# Patient Record
Sex: Female | Born: 1945 | Race: White | Hispanic: No | Marital: Married | Smoking: Never smoker
Health system: Southern US, Community
[De-identification: ages and names within clinical notes are randomized; demographics above are authoritative.]

## PROBLEM LIST (undated history)

## (undated) DIAGNOSIS — K219 Gastro-esophageal reflux disease without esophagitis: Secondary | ICD-10-CM

## (undated) DIAGNOSIS — T4145XA Adverse effect of unspecified anesthetic, initial encounter: Secondary | ICD-10-CM

## (undated) DIAGNOSIS — T8859XA Other complications of anesthesia, initial encounter: Secondary | ICD-10-CM

## (undated) DIAGNOSIS — M1990 Unspecified osteoarthritis, unspecified site: Secondary | ICD-10-CM

## (undated) HISTORY — PX: JOINT REPLACEMENT: SHX530

## (undated) HISTORY — PX: TUBAL LIGATION: SHX77

## (undated) HISTORY — PX: DILATION AND CURETTAGE OF UTERUS: SHX78

---
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
## 1965-06-02 HISTORY — PX: CHOLECYSTECTOMY: SHX55

## 2004-11-20 ENCOUNTER — Ambulatory Visit: Payer: Self-pay | Admitting: Family Medicine

## 2005-01-10 ENCOUNTER — Ambulatory Visit: Payer: Self-pay | Admitting: Gastroenterology

## 2015-02-22 ENCOUNTER — Other Ambulatory Visit: Payer: Self-pay | Admitting: Family Medicine

## 2017-01-23 ENCOUNTER — Ambulatory Visit
Admission: RE | Admit: 2017-01-23 | Discharge: 2017-01-23 | Disposition: A | Discharge status: Home / Self Care | Payer: Medicare Other | Source: Ambulatory Visit | Attending: Orthopedic Surgery | Admitting: Orthopedic Surgery

## 2017-01-23 DIAGNOSIS — M1990 Unspecified osteoarthritis, unspecified site: Secondary | ICD-10-CM | POA: Diagnosis not present

## 2017-01-23 DIAGNOSIS — Z01810 Encounter for preprocedural cardiovascular examination: Secondary | ICD-10-CM | POA: Diagnosis present

## 2017-01-23 DIAGNOSIS — Z01812 Encounter for preprocedural laboratory examination: Secondary | ICD-10-CM | POA: Insufficient documentation

## 2017-01-23 DIAGNOSIS — K219 Gastro-esophageal reflux disease without esophagitis: Secondary | ICD-10-CM | POA: Diagnosis not present

## 2017-01-23 HISTORY — DX: Unspecified osteoarthritis, unspecified site: M19.90

## 2017-01-23 HISTORY — DX: Adverse effect of unspecified anesthetic, initial encounter: T41.45XA

## 2017-01-23 HISTORY — DX: Other complications of anesthesia, initial encounter: T88.59XA

## 2017-01-23 HISTORY — DX: Gastro-esophageal reflux disease without esophagitis: K21.9

## 2017-01-23 LAB — URINALYSIS, ROUTINE W REFLEX MICROSCOPIC
Bacteria, UA: NONE SEEN
Bilirubin Urine: NEGATIVE
GLUCOSE, UA: NEGATIVE mg/dL
HGB URINE DIPSTICK: NEGATIVE
KETONES UR: NEGATIVE mg/dL
NITRITE: NEGATIVE
PH: 5 (ref 5.0–8.0)
PROTEIN: NEGATIVE mg/dL
Specific Gravity, Urine: 1.011 (ref 1.005–1.030)

## 2017-01-23 LAB — COMPREHENSIVE METABOLIC PANEL
ALBUMIN: 4.4 g/dL (ref 3.5–5.0)
ALK PHOS: 59 U/L (ref 38–126)
ALT: 11 U/L — ABNORMAL LOW (ref 14–54)
AST: 16 U/L (ref 15–41)
Anion gap: 8 (ref 5–15)
BILIRUBIN TOTAL: 0.7 mg/dL (ref 0.3–1.2)
BUN: 11 mg/dL (ref 6–20)
CALCIUM: 9.4 mg/dL (ref 8.9–10.3)
CO2: 28 mmol/L (ref 22–32)
CREATININE: 0.75 mg/dL (ref 0.44–1.00)
Chloride: 102 mmol/L (ref 101–111)
GFR calc Af Amer: >60 mL/min (ref >60)
GFR calc non Af Amer: >60 mL/min (ref >60)
GLUCOSE: 98 mg/dL (ref 65–99)
Potassium: 3.8 mmol/L (ref 3.5–5.1)
SODIUM: 138 mmol/L (ref 135–145)
TOTAL PROTEIN: 7.5 g/dL (ref 6.5–8.1)

## 2017-01-23 LAB — SURGICAL PCR SCREEN
MRSA, PCR: NEGATIVE
STAPHYLOCOCCUS AUREUS: POSITIVE — AB

## 2017-01-23 LAB — TYPE AND SCREEN
ABO/RH(D): O POS
Antibody Screen: NEGATIVE

## 2017-01-23 LAB — CBC
HEMATOCRIT: 38.6 % (ref 35.0–47.0)
HEMOGLOBIN: 12.8 g/dL (ref 12.0–16.0)
MCH: 30.2 pg (ref 26.0–34.0)
MCHC: 33.2 g/dL (ref 32.0–36.0)
MCV: 90.8 fL (ref 80.0–100.0)
Platelets: 238 10*3/uL (ref 150–440)
RBC: 4.25 MIL/uL (ref 3.80–5.20)
RDW: 13.2 % (ref 11.5–14.5)
WBC: 4.6 10*3/uL (ref 3.6–11.0)

## 2017-01-23 LAB — PROTIME-INR
INR: 0.92
Prothrombin Time: 12.4 seconds (ref 11.4–15.2)

## 2017-01-23 LAB — APTT: APTT: 37 s — AB (ref 24–36)

## 2017-01-23 LAB — SEDIMENTATION RATE: Sed Rate: 19 mm/hr (ref 0–30)

## 2017-01-23 NOTE — Patient Instructions (Signed)
  Your procedure is scheduled on:Wednesday Sept. 5 , 2018. Report to Same Day Surgery. To find out your arrival time please call [phone] between 1PM - 3PM on Tuesday Sept. 4, 2018.  Remember: Instructions that are not followed completely may result in serious medical risk, up to and including death, or upon the discretion of your surgeon and anesthesiologist your surgery may need to be rescheduled.    _x___ 1. Do not eat food or drink liquids after midnight. No gum chewing or hard candies. You can have clear   liquids of water, apple juice, gator aide, black coffee or black tea up until 2 hour prior to arrival time.    ____ 2. No Alcohol for 24 hours before or after surgery.   ____ 3. Bring all medications with you on the day of surgery if instructed.    __x__ 4. Notify your doctor if there is any change in your medical condition     (cold, fever, infections).    _____ 5. No smoking 24 hours prior to surgery.     Do not wear jewelry, make-up, hairpins, clips or nail polish.  Do not wear lotions, powders, or perfumes.   Do not shave 48 hours prior to surgery. Men may shave face and neck.  Do not bring valuables to the hospital.    [facility] is not responsible for any belongings or valuables.               Contacts, dentures or bridgework may not be worn into surgery.  Leave your suitcase in the car. After surgery it may be brought to your room.  For patients admitted to the hospital, discharge time is determined by your treatment team.   Patients discharged the day of surgery will not be allowed to drive home.    Please read over the following fact sheets that you were given:   [facility] Preparing for Surgery  ____ Take these medicines the morning of surgery with A SIP OF WATER: NONE     ____ Fleet Enema (as directed)   __x__ Use CHG Soap as directed on instruction sheet  ____ Use inhalers on the day of surgery and bring to hospital day of surgery  ____ Stop  metformin 2 days prior to surgery    ____ Take 1/2 of usual insulin dose the night before surgery and none on the morning of  surgery.   ____ Stop Coumadin/Plavix/aspirin on does not apply.  __x__ Stop Anti-inflammatories such as Advil, Aleve, Ibuprofen, Motrin, Naproxen, Naprosyn, Goodies powders or aspirin  products. OK to take Tylenol.   ____ Stop supplements until after surgery.    ____ Bring C-Pap to the hospital.

## 2017-01-23 NOTE — Pre-Procedure Instructions (Signed)
Spoke with Dr. Randa Ngo regarding EKG, no previous EKG to compare.  Dr. Randa Ngo wants pt pt be seen by PCP for medical clearance prior to left total knee replacement.  Clearance faxed to Dr. Elenor Legato office.

## 2017-01-24 LAB — C-REACTIVE PROTEIN: CRP: <0.8 mg/dL (ref <1.0)

## 2017-01-25 LAB — URINE CULTURE
CULTURE: NO GROWTH
Special Requests: NORMAL

## 2017-01-28 NOTE — Pre-Procedure Instructions (Signed)
Spoke with Sarina SerEaine at [facility] [employer]. Anesthesia does want clearance by pcp and cardiac if pcp deems necessary to clear

## 2017-02-03 MED ORDER — TRANEXAMIC ACID 1000 MG/10ML IV SOLN
1000.0000 mg | INTRAVENOUS | Status: DC
  Filled 2017-02-03: qty 10

## 2017-02-03 MED ORDER — CEFAZOLIN SODIUM-DEXTROSE 2-4 GM/100ML-% IV SOLN: 2.0000 g | INTRAVENOUS | Status: DC

## 2017-02-03 NOTE — Pre-Procedure Instructions (Signed)
CLEARED BY DR PARASCHOS LOW RISK 01/23/17

## 2017-02-04 ENCOUNTER — Encounter
Admission: RE | Disposition: A | Discharge status: Home Health | Payer: Self-pay | Source: Ambulatory Visit | Attending: Orthopedic Surgery

## 2017-02-04 ENCOUNTER — Inpatient Hospital Stay
Admission: RE | Admit: 2017-02-04 | Discharge: 2017-02-06 | DRG: 470 | Disposition: A | Discharge status: Home Health | Payer: Medicare Other | Source: Ambulatory Visit | Attending: Orthopedic Surgery | Admitting: Orthopedic Surgery

## 2017-02-04 ENCOUNTER — Inpatient Hospital Stay: Payer: Medicare Other | Admitting: Anesthesiology

## 2017-02-04 ENCOUNTER — Inpatient Hospital Stay: Payer: Medicare Other

## 2017-02-04 ENCOUNTER — Encounter: Payer: Self-pay | Admitting: Orthopedic Surgery

## 2017-02-04 DIAGNOSIS — M1712 Unilateral primary osteoarthritis, left knee: Secondary | ICD-10-CM | POA: Diagnosis present

## 2017-02-04 DIAGNOSIS — Z96652 Presence of left artificial knee joint: Secondary | ICD-10-CM

## 2017-02-04 DIAGNOSIS — K219 Gastro-esophageal reflux disease without esophagitis: Secondary | ICD-10-CM | POA: Diagnosis present

## 2017-02-04 DIAGNOSIS — Z96659 Presence of unspecified artificial knee joint: Secondary | ICD-10-CM

## 2017-02-04 HISTORY — PX: KNEE ARTHROPLASTY: SHX992

## 2017-02-04 LAB — ABO/RH: ABO/RH(D): O POS

## 2017-02-04 SURGERY — ARTHROPLASTY, KNEE, TOTAL, USING IMAGELESS COMPUTER-ASSISTED NAVIGATION
Anesthesia: Spinal | Laterality: Left

## 2017-02-04 MED ORDER — TRAMADOL HCL 50 MG PO TABS
50.0000 mg | ORAL_TABLET | ORAL | Status: DC | PRN
  Administered 2017-02-04 (×2): 50 mg via ORAL
  Administered 2017-02-05 (×2): 100 mg via ORAL
  Administered 2017-02-05: 50 mg via ORAL
  Administered 2017-02-05 – 2017-02-06 (×2): 100 mg via ORAL
  Filled 2017-02-04 (×2): qty 2
  Filled 2017-02-04: qty 1
  Filled 2017-02-04 (×2): qty 2
  Filled 2017-02-04 (×2): qty 1

## 2017-02-04 MED ORDER — FLEET ENEMA 7-19 GM/118ML RE ENEM: 1.0000 | ENEMA | Freq: Once | RECTAL | Status: DC | PRN

## 2017-02-04 MED ORDER — PHENOL 1.4 % MT LIQD
1.0000 | OROMUCOSAL | Status: DC | PRN
  Filled 2017-02-04: qty 177

## 2017-02-04 MED ORDER — ONDANSETRON HCL 4 MG/2ML IJ SOLN
INTRAMUSCULAR | Status: DC | PRN
  Administered 2017-02-04: 4 mg via INTRAVENOUS

## 2017-02-04 MED ORDER — FAMOTIDINE 20 MG PO TABS
ORAL_TABLET | ORAL | Status: AC
  Administered 2017-02-04: 20 mg via ORAL
  Filled 2017-02-04: qty 1

## 2017-02-04 MED ORDER — OXYCODONE HCL 5 MG PO TABS
5.0000 mg | ORAL_TABLET | ORAL | Status: DC | PRN
  Administered 2017-02-04: 5 mg via ORAL
  Administered 2017-02-05: 10 mg via ORAL
  Filled 2017-02-04: qty 2
  Filled 2017-02-04: qty 1

## 2017-02-04 MED ORDER — NEOMYCIN-POLYMYXIN B GU 40-200000 IR SOLN
Status: DC | PRN
  Administered 2017-02-04: 14 mL

## 2017-02-04 MED ORDER — CEFAZOLIN SODIUM-DEXTROSE 2-4 GM/100ML-% IV SOLN
2.0000 g | Freq: Four times a day (QID) | INTRAVENOUS | Status: AC
  Administered 2017-02-04 – 2017-02-05 (×4): 2 g via INTRAVENOUS
  Filled 2017-02-04 (×4): qty 100

## 2017-02-04 MED ORDER — MAGNESIUM HYDROXIDE 400 MG/5ML PO SUSP
30.0000 mL | Freq: Every day | ORAL | Status: DC | PRN
  Administered 2017-02-05: 30 mL via ORAL
  Filled 2017-02-04: qty 30

## 2017-02-04 MED ORDER — FENTANYL CITRATE (PF) 100 MCG/2ML IJ SOLN
25.0000 ug | INTRAMUSCULAR | Status: DC | PRN
  Administered 2017-02-04 (×2): 25 ug via INTRAVENOUS

## 2017-02-04 MED ORDER — FAMOTIDINE 20 MG PO TABS
20.0000 mg | ORAL_TABLET | Freq: Once | ORAL | Status: AC
  Administered 2017-02-04: 20 mg via ORAL

## 2017-02-04 MED ORDER — ACETAMINOPHEN 10 MG/ML IV SOLN
1000.0000 mg | Freq: Four times a day (QID) | INTRAVENOUS | Status: AC
  Administered 2017-02-04 – 2017-02-05 (×4): 1000 mg via INTRAVENOUS
  Filled 2017-02-04 (×4): qty 100

## 2017-02-04 MED ORDER — DIPHENHYDRAMINE HCL 12.5 MG/5ML PO ELIX: 12.5000 mg | ORAL_SOLUTION | ORAL | Status: DC | PRN

## 2017-02-04 MED ORDER — CEFAZOLIN SODIUM-DEXTROSE 2-3 GM-% IV SOLR
INTRAVENOUS | Status: DC | PRN
  Administered 2017-02-04: 2 g via INTRAVENOUS

## 2017-02-04 MED ORDER — ONDANSETRON HCL 4 MG/2ML IJ SOLN
INTRAMUSCULAR | Status: AC
  Filled 2017-02-04: qty 2

## 2017-02-04 MED ORDER — METOCLOPRAMIDE HCL 10 MG PO TABS
10.0000 mg | ORAL_TABLET | Freq: Three times a day (TID) | ORAL | Status: DC
  Administered 2017-02-04 – 2017-02-06 (×6): 10 mg via ORAL
  Filled 2017-02-04 (×6): qty 1

## 2017-02-04 MED ORDER — SODIUM CHLORIDE 0.9 % IV SOLN
INTRAVENOUS | Status: DC | PRN
  Administered 2017-02-04: 1000 mg via INTRAVENOUS

## 2017-02-04 MED ORDER — FENTANYL CITRATE (PF) 100 MCG/2ML IJ SOLN
INTRAMUSCULAR | Status: AC
  Filled 2017-02-04: qty 2

## 2017-02-04 MED ORDER — NEOMYCIN-POLYMYXIN B GU 40-200000 IR SOLN
Status: AC
  Filled 2017-02-04: qty 20

## 2017-02-04 MED ORDER — ALUM & MAG HYDROXIDE-SIMETH 200-200-20 MG/5ML PO SUSP: 30.0000 mL | ORAL | Status: DC | PRN

## 2017-02-04 MED ORDER — CELECOXIB 200 MG PO CAPS
200.0000 mg | ORAL_CAPSULE | Freq: Two times a day (BID) | ORAL | Status: DC
  Administered 2017-02-04 – 2017-02-06 (×4): 200 mg via ORAL
  Filled 2017-02-04 (×4): qty 1

## 2017-02-04 MED ORDER — SODIUM CHLORIDE 0.9 % IV SOLN
INTRAVENOUS | Status: DC
  Administered 2017-02-04 – 2017-02-05 (×2): via INTRAVENOUS

## 2017-02-04 MED ORDER — MIDAZOLAM HCL 5 MG/5ML IJ SOLN
INTRAMUSCULAR | Status: DC | PRN
  Administered 2017-02-04: 2 mg via INTRAVENOUS

## 2017-02-04 MED ORDER — ONDANSETRON HCL 4 MG/2ML IJ SOLN
4.0000 mg | Freq: Once | INTRAMUSCULAR | Status: DC | PRN
Start: 2017-02-04 — End: 2017-02-04

## 2017-02-04 MED ORDER — CHLORHEXIDINE GLUCONATE 4 % EX LIQD: 60.0000 mL | Freq: Once | CUTANEOUS | Status: DC

## 2017-02-04 MED ORDER — ACETAMINOPHEN 325 MG PO TABS: 650.0000 mg | ORAL_TABLET | Freq: Four times a day (QID) | ORAL | Status: DC | PRN

## 2017-02-04 MED ORDER — MIDAZOLAM HCL 2 MG/2ML IJ SOLN
INTRAMUSCULAR | Status: AC
  Filled 2017-02-04: qty 2

## 2017-02-04 MED ORDER — LACTATED RINGERS IV SOLN
INTRAVENOUS | Status: DC
  Administered 2017-02-04 (×3): via INTRAVENOUS

## 2017-02-04 MED ORDER — BUPIVACAINE HCL (PF) 0.25 % IJ SOLN
INTRAMUSCULAR | Status: DC | PRN
  Administered 2017-02-04: 60 mL

## 2017-02-04 MED ORDER — ONDANSETRON HCL 4 MG PO TABS: 4.0000 mg | ORAL_TABLET | Freq: Four times a day (QID) | ORAL | Status: DC | PRN

## 2017-02-04 MED ORDER — DEXAMETHASONE SODIUM PHOSPHATE 10 MG/ML IJ SOLN
INTRAMUSCULAR | Status: AC
  Filled 2017-02-04: qty 1

## 2017-02-04 MED ORDER — MORPHINE SULFATE (PF) 2 MG/ML IV SOLN: 2.0000 mg | INTRAVENOUS | Status: DC | PRN

## 2017-02-04 MED ORDER — SODIUM CHLORIDE 0.9 % IJ SOLN
INTRAMUSCULAR | Status: AC
  Filled 2017-02-04: qty 50

## 2017-02-04 MED ORDER — PROPOFOL 10 MG/ML IV BOLUS
INTRAVENOUS | Status: AC
  Filled 2017-02-04: qty 20

## 2017-02-04 MED ORDER — DEXAMETHASONE SODIUM PHOSPHATE 10 MG/ML IJ SOLN
INTRAMUSCULAR | Status: DC | PRN
  Administered 2017-02-04: 10 mg via INTRAVENOUS

## 2017-02-04 MED ORDER — FERROUS SULFATE 325 (65 FE) MG PO TABS
325.0000 mg | ORAL_TABLET | Freq: Two times a day (BID) | ORAL | Status: DC
  Administered 2017-02-05 – 2017-02-06 (×3): 325 mg via ORAL
  Filled 2017-02-04 (×3): qty 1

## 2017-02-04 MED ORDER — CEFAZOLIN SODIUM-DEXTROSE 2-4 GM/100ML-% IV SOLN
INTRAVENOUS | Status: AC
  Filled 2017-02-04: qty 100

## 2017-02-04 MED ORDER — SENNOSIDES-DOCUSATE SODIUM 8.6-50 MG PO TABS
1.0000 | ORAL_TABLET | Freq: Two times a day (BID) | ORAL | Status: DC
  Administered 2017-02-04 – 2017-02-06 (×4): 1 via ORAL
  Filled 2017-02-04 (×4): qty 1

## 2017-02-04 MED ORDER — TRANEXAMIC ACID 1000 MG/10ML IV SOLN
1000.0000 mg | Freq: Once | INTRAVENOUS | Status: AC
  Administered 2017-02-04: 1000 mg via INTRAVENOUS
  Filled 2017-02-04: qty 10

## 2017-02-04 MED ORDER — PROPOFOL 500 MG/50ML IV EMUL
INTRAVENOUS | Status: AC
  Filled 2017-02-04: qty 50

## 2017-02-04 MED ORDER — PANTOPRAZOLE SODIUM 40 MG PO TBEC
40.0000 mg | DELAYED_RELEASE_TABLET | Freq: Two times a day (BID) | ORAL | Status: DC
  Administered 2017-02-04 – 2017-02-06 (×4): 40 mg via ORAL
  Filled 2017-02-04 (×4): qty 1

## 2017-02-04 MED ORDER — BISACODYL 10 MG RE SUPP: 10.0000 mg | Freq: Every day | RECTAL | Status: DC | PRN

## 2017-02-04 MED ORDER — PROPOFOL 10 MG/ML IV BOLUS
INTRAVENOUS | Status: DC | PRN
Start: 2017-02-04 — End: 2017-02-04
  Administered 2017-02-04: 20 mg via INTRAVENOUS

## 2017-02-04 MED ORDER — SODIUM CHLORIDE 0.9 % IV SOLN
INTRAVENOUS | Status: DC | PRN
  Administered 2017-02-04: 60 mL

## 2017-02-04 MED ORDER — ENOXAPARIN SODIUM 30 MG/0.3ML ~~LOC~~ SOLN
30.0000 mg | Freq: Two times a day (BID) | SUBCUTANEOUS | Status: DC
  Administered 2017-02-05 – 2017-02-06 (×3): 30 mg via SUBCUTANEOUS
  Filled 2017-02-04 (×3): qty 0.3

## 2017-02-04 MED ORDER — ACETAMINOPHEN 650 MG RE SUPP: 650.0000 mg | Freq: Four times a day (QID) | RECTAL | Status: DC | PRN

## 2017-02-04 MED ORDER — MENTHOL 3 MG MT LOZG
1.0000 | LOZENGE | OROMUCOSAL | Status: DC | PRN
  Filled 2017-02-04: qty 9

## 2017-02-04 MED ORDER — BUPIVACAINE HCL (PF) 0.25 % IJ SOLN
INTRAMUSCULAR | Status: AC
  Filled 2017-02-04: qty 60

## 2017-02-04 MED ORDER — PROPOFOL 500 MG/50ML IV EMUL
INTRAVENOUS | Status: DC | PRN
  Administered 2017-02-04: 75 ug/kg/min via INTRAVENOUS

## 2017-02-04 MED ORDER — ONDANSETRON HCL 4 MG/2ML IJ SOLN: 4.0000 mg | Freq: Four times a day (QID) | INTRAMUSCULAR | Status: DC | PRN

## 2017-02-04 MED ORDER — BUPIVACAINE LIPOSOME 1.3 % IJ SUSP
INTRAMUSCULAR | Status: AC
  Filled 2017-02-04: qty 20

## 2017-02-04 SURGICAL SUPPLY — 63 items
BATTERY INSTRU NAVIGATION (MISCELLANEOUS) ×12 IMPLANT
BLADE SAW 1 (BLADE) ×3 IMPLANT
BLADE SAW 1/2 (BLADE) ×3 IMPLANT
BLADE SAW 70X12.5 (BLADE) IMPLANT
CANISTER SUCT 1200ML W/VALVE (MISCELLANEOUS) ×3 IMPLANT
CANISTER SUCT 3000ML PPV (MISCELLANEOUS) ×6 IMPLANT
CAPT KNEE TOTAL 3 ATTUNE ×3 IMPLANT
CATH TRAY METER 16FR LF (MISCELLANEOUS) ×3 IMPLANT
CEMENT HV SMART SET (Cement) ×6 IMPLANT
COOLER POLAR GLACIER W/PUMP (MISCELLANEOUS) ×3 IMPLANT
CUFF TOURN 24 STER (MISCELLANEOUS) ×3 IMPLANT
CUFF TOURN 30 STER DUAL PORT (MISCELLANEOUS) IMPLANT
DRAPE SHEET LG 3/4 BI-LAMINATE (DRAPES) ×3 IMPLANT
DRSG DERMACEA 8X12 NADH (GAUZE/BANDAGES/DRESSINGS) ×3 IMPLANT
DRSG OPSITE POSTOP 4X14 (GAUZE/BANDAGES/DRESSINGS) ×3 IMPLANT
DRSG TEGADERM 4X4.75 (GAUZE/BANDAGES/DRESSINGS) ×3 IMPLANT
DURAPREP 26ML APPLICATOR (WOUND CARE) ×6 IMPLANT
ELECT CAUTERY BLADE 6.4 (BLADE) ×3 IMPLANT
ELECT REM PT RETURN 9FT ADLT (ELECTROSURGICAL) ×3
ELECTRODE REM PT RTRN 9FT ADLT (ELECTROSURGICAL) ×1 IMPLANT
EVACUATOR 1/8 PVC DRAIN (DRAIN) ×3 IMPLANT
EX-PIN ORTHOLOCK NAV 4X150 (PIN) ×6 IMPLANT
GLOVE BIO SURGEON STRL SZ7.5 (GLOVE) ×18 IMPLANT
GLOVE BIOGEL M STRL SZ7.5 (GLOVE) ×12 IMPLANT
GLOVE BIOGEL PI IND STRL 9 (GLOVE) ×1 IMPLANT
GLOVE BIOGEL PI INDICATOR 9 (GLOVE) ×2
GLOVE INDICATOR 8.0 STRL GRN (GLOVE) ×3 IMPLANT
GLOVE SURG SYN 9.0  PF PI (GLOVE) ×2
GLOVE SURG SYN 9.0 PF PI (GLOVE) ×1 IMPLANT
GOWN STRL REUS W/ TWL LRG LVL3 (GOWN DISPOSABLE) ×3 IMPLANT
GOWN STRL REUS W/TWL 2XL LVL3 (GOWN DISPOSABLE) ×3 IMPLANT
GOWN STRL REUS W/TWL LRG LVL3 (GOWN DISPOSABLE) ×6
HOLDER FOLEY CATH W/STRAP (MISCELLANEOUS) ×3 IMPLANT
HOOD PEEL AWAY FLYTE STAYCOOL (MISCELLANEOUS) ×6 IMPLANT
KIT RM TURNOVER STRD PROC AR (KITS) ×3 IMPLANT
KNIFE SCULPS 14X20 (INSTRUMENTS) ×3 IMPLANT
LABEL OR SOLS (LABEL) ×3 IMPLANT
NDL SAFETY 18GX1.5 (NEEDLE) ×3 IMPLANT
NEEDLE SPNL 20GX3.5 QUINCKE YW (NEEDLE) ×3 IMPLANT
NS IRRIG 500ML POUR BTL (IV SOLUTION) ×3 IMPLANT
PACK TOTAL KNEE (MISCELLANEOUS) ×3 IMPLANT
PAD WRAPON POLAR KNEE (MISCELLANEOUS) ×1 IMPLANT
PIN DRILL QUICK PACK ×3 IMPLANT
PIN FIXATION 1/8DIA X 3INL (PIN) ×3 IMPLANT
PULSAVAC PLUS IRRIG FAN TIP (DISPOSABLE) ×3
SOL .9 NS 3000ML IRR  AL (IV SOLUTION) ×2
SOL .9 NS 3000ML IRR UROMATIC (IV SOLUTION) ×1 IMPLANT
SOL PREP PVP 2OZ (MISCELLANEOUS) ×3
SOLUTION PREP PVP 2OZ (MISCELLANEOUS) ×1 IMPLANT
SPONGE DRAIN TRACH 4X4 STRL 2S (GAUZE/BANDAGES/DRESSINGS) ×3 IMPLANT
STAPLER SKIN PROX 35W (STAPLE) ×3 IMPLANT
STRAP TIBIA SHORT (MISCELLANEOUS) ×3 IMPLANT
SUCTION FRAZIER HANDLE 10FR (MISCELLANEOUS) ×2
SUCTION TUBE FRAZIER 10FR DISP (MISCELLANEOUS) ×1 IMPLANT
SUT VIC AB 0 CT1 36 (SUTURE) ×3 IMPLANT
SUT VIC AB 1 CT1 36 (SUTURE) ×6 IMPLANT
SUT VIC AB 2-0 CT2 27 (SUTURE) ×3 IMPLANT
SYR 20CC LL (SYRINGE) ×3 IMPLANT
SYR 30ML LL (SYRINGE) ×6 IMPLANT
TIP FAN IRRIG PULSAVAC PLUS (DISPOSABLE) ×1 IMPLANT
TOWEL OR 17X26 4PK STRL BLUE (TOWEL DISPOSABLE) ×3 IMPLANT
TOWER CARTRIDGE SMART MIX (DISPOSABLE) ×3 IMPLANT
WRAPON POLAR PAD KNEE (MISCELLANEOUS) ×3

## 2017-02-04 NOTE — NC FL2 (Signed)
[place] MEDICAID FL2 LEVEL OF CARE SCREENING TOOL     IDENTIFICATION  Patient Name: Tanya Vazquez Birthdate: 1946/04/22 Sex: female Admission Date (Current Location): 02/04/2017  [place] and [place] Number:  Chiropodist and Address:  [facility], [address] [place], [place] [address]      Provider Number: [id number]  Attending Physician Name and Address:  Donato Heinz, MD  Relative Name and Phone Number:       Current Level of Care: Hospital Recommended Level of Care: Skilled Nursing Facility Prior Approval Number:    Date Approved/Denied:   PASRR Number:  [id number])  Discharge Plan: SNF    Current Diagnoses: Patient Active Problem List   Diagnosis Date Noted  . 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 )  AMBULATORY STATUS COMMUNICATION OF NEEDS Skin   Extensive Assist Verbally Surgical wounds (Incision: Left Knee )                       Personal Care Assistance Level of Assistance  Bathing, Feeding, Dressing Bathing Assistance: Limited assistance Feeding assistance: Independent Dressing Assistance: Limited assistance     Functional Limitations Info  Sight, Hearing, Speech Sight Info: Adequate Hearing Info: Adequate Speech Info: Adequate    SPECIAL CARE FACTORS FREQUENCY  PT (By licensed PT), OT (By licensed OT)     PT Frequency:  (5) OT Frequency:  (5)            Contractures      Additional Factors Info  Code Status, Allergies Code Status Info:  (Full Code. ) Allergies Info:  (No Known Allergies. )           Current Medications (02/04/2017):  This is the current hospital active medication list Current Facility-Administered  Medications  Medication Dose Route Frequency Provider Last Rate Last Dose  . 0.9 %  sodium chloride infusion   Intravenous Continuous Hooten, Illene Labrador, MD 100 mL/hr at 02/04/17 1657    . acetaminophen (OFIRMEV) IV 1,000 mg  1,000 mg Intravenous Q6H Hooten, Illene Labrador, MD   Stopped at 02/04/17 1807  . acetaminophen (TYLENOL) tablet 650 mg  650 mg Oral Q6H PRN Hooten, Illene Labrador, MD       Or  . acetaminophen (TYLENOL) suppository 650 mg  650 mg Rectal Q6H PRN Hooten, Illene Labrador, MD      . alum & mag hydroxide-simeth (MAALOX/MYLANTA) 200-200-20 MG/5ML suspension 30 mL  30 mL Oral Q4H PRN Hooten, Illene Labrador, MD      . bisacodyl (DULCOLAX) suppository 10 mg  10 mg Rectal Daily PRN Hooten, Illene Labrador, MD      . ceFAZolin (ANCEF) 2-4 GM/100ML-% IVPB           . ceFAZolin (ANCEF) IVPB 2g/100 mL premix  2 g Intravenous Q6H Hooten, Illene Labrador, MD 200 mL/hr at 02/04/17 1837 2 g at 02/04/17 1837  . celecoxib (CELEBREX) capsule 200 mg  200 mg Oral Q12H Hooten, Illene Labrador, MD      . diphenhydrAMINE (BENADRYL) 12.5 MG/5ML elixir 12.5-25 mg  12.5-25 mg Oral Q4H PRN Donato Heinz, MD      . Melene Muller ON 02/05/2017] enoxaparin (LOVENOX) injection 30 mg  30 mg Subcutaneous Q12H Hooten, Illene LabradorJames P, MD      . fentaNYL (SUBLIMAZE) 100 MCG/2ML injection           . ferrous sulfate tablet 325 mg  325 mg Oral BID WC Hooten, Illene LabradorJames P, MD      . magnesium hydroxide (MILK OF MAGNESIA) suspension 30 mL  30 mL Oral Daily PRN Hooten, Illene LabradorJames P, MD      . menthol-cetylpyridinium (CEPACOL) lozenge 3 mg  1 lozenge Oral PRN Hooten, Illene LabradorJames P, MD       Or  . phenol (CHLORASEPTIC) mouth spray 1 spray  1 spray Mouth/Throat PRN Hooten, Illene LabradorJames P, MD      . metoCLOPramide (REGLAN) tablet 10 mg  10 mg Oral TID AC & HS Hooten, Illene LabradorJames P, MD      . morphine 2 MG/ML injection 2 mg  2 mg Intravenous Q2H PRN Hooten, Illene LabradorJames P, MD      . ondansetron (ZOFRAN) tablet 4 mg  4 mg Oral Q6H PRN Hooten, Illene LabradorJames P, MD       Or  . ondansetron (ZOFRAN) injection 4 mg  4 mg Intravenous Q6H  PRN Hooten, Illene LabradorJames P, MD      . oxyCODONE (Oxy IR/ROXICODONE) immediate release tablet 5-10 mg  5-10 mg Oral Q4H PRN Hooten, Illene LabradorJames P, MD   5 mg at 02/04/17 1710  . pantoprazole (PROTONIX) EC tablet 40 mg  40 mg Oral BID Hooten, Illene LabradorJames P, MD      . senna-docusate (Senokot-S) tablet 1 tablet  1 tablet Oral BID Hooten, Illene LabradorJames P, MD      . sodium phosphate (FLEET) 7-19 GM/118ML enema 1 enema  1 enema Rectal Once PRN Hooten, Illene LabradorJames P, MD      . traMADol Janean Sark(ULTRAM) tablet 50-100 mg  50-100 mg Oral Q4H PRN Hooten, Illene LabradorJames P, MD         Discharge Medications: Please see discharge summary for a list of discharge medications.  Relevant Imaging Results:  Relevant Lab Results:   Additional Information  (SSN: [id number])  Sample, Darleen CrockerBailey M, LCSW

## 2017-02-04 NOTE — H&P (Signed)
The patient has been re-examined, and the chart reviewed, and there have been no interval changes to the documented history and physical.    The risks, benefits, and alternatives have been discussed at length. The patient expressed understanding of the risks benefits and agreed with plans for surgical intervention.  James P. Hooten, Jr. M.D.    

## 2017-02-04 NOTE — Anesthesia Preprocedure Evaluation (Signed)
Anesthesia Evaluation  Patient identified by MRN, date of birth, ID band Patient awake    Reviewed: Allergy & Precautions, NPO status , Patient's Chart, lab work & pertinent test results  History of Anesthesia Complications (+) PROLONGED EMERGENCE  Airway Mallampati: II       Dental   Pulmonary neg sleep apnea, neg COPD,           Cardiovascular (-) hypertension(-) Past MI and (-) CHF (-) dysrhythmias (-) Valvular Problems/Murmurs     Neuro/Psych neg Seizures    GI/Hepatic Neg liver ROS, GERD (occasional indigestion, otherwise no problems)  ,  Endo/Other  neg diabetes  Renal/GU negative Renal ROS     Musculoskeletal   Abdominal   Peds  Hematology   Anesthesia Other Findings   Reproductive/Obstetrics                             Anesthesia Physical Anesthesia Plan  ASA: II  Anesthesia Plan: Spinal   Post-op Pain Management:    Induction:   PONV Risk Score and Plan:   Airway Management Planned:   Additional Equipment:   Intra-op Plan:   Post-operative Plan:   Informed Consent: I have reviewed the patients History and Physical, chart, labs and discussed the procedure including the risks, benefits and alternatives for the proposed anesthesia with the patient or authorized representative who has indicated his/her understanding and acceptance.     Plan Discussed with:   Anesthesia Plan Comments:         Anesthesia Quick Evaluation

## 2017-02-04 NOTE — Transfer of Care (Signed)
Immediate Anesthesia Transfer of Care Note  Patient: Tanya FountainJudy Vazquez  Procedure(s) Performed: Procedure(s): COMPUTER ASSISTED TOTAL KNEE ARTHROPLASTY (Left)  Patient Location: PACU  Anesthesia Type:Spinal  Level of Consciousness: awake, drowsy and patient cooperative  Airway & Oxygen Therapy: Patient Spontanous Breathing and Patient connected to face mask oxygen  Post-op Assessment: Report given to RN and Post -op Vital signs reviewed and stable  Post vital signs: Reviewed and stable  Last Vitals:  Vitals:   02/04/17 0930  BP: (!) 192/87  Pulse: 84  Resp: 18  Temp: 36.6 C  SpO2: 100%    Last Pain:  Vitals:   02/04/17 0930  PainSc: 3          Complications: No apparent anesthesia complications

## 2017-02-04 NOTE — Op Note (Signed)
OPERATIVE NOTE  DATE OF SURGERY:  02/04/2017  PATIENT NAME:  Tanya Vazquez   DOB: 12-30-1945  MRN: [id number]  PRE-OPERATIVE DIAGNOSIS: Degenerative arthrosis of the left knee, primary  POST-OPERATIVE DIAGNOSIS:  Same  PROCEDURE:  Left total knee arthroplasty using computer-assisted navigation  SURGEON:  Jena GaussJames P Hooten, Jr. M.D.  ASSISTANT:  Dedra Skeensodd Mundy, PA-C (present and scrubbed throughout the case, critical for assistance with exposure, retraction, instrumentation, and closure)  ANESTHESIA: spinal  ESTIMATED BLOOD LOSS: 50 mL  FLUIDS REPLACED: 1650 mL of crystalloid  TOURNIQUET TIME: 106 minutes  DRAINS: 2 medium Hemovac drains  SOFT TISSUE RELEASES: Anterior cruciate ligament, posterior cruciate ligament, deep medial collateral ligament, patellofemoral ligament  IMPLANTS UTILIZED: DePuy Attune size 5 posterior stabilized femoral component (cemented), size 5 rotating platform tibial component (cemented), 38 mm medialized dome patella (cemented), and a 8 mm stabilized rotating platform polyethylene insert.  INDICATIONS FOR SURGERY: Tanya FountainJudy Vazquez is a 71 y.o. year old female with a long history of progressive knee pain. X-rays demonstrated severe degenerative changes in tricompartmental fashion. The patient had not seen any significant improvement despite conservative nonsurgical intervention. After discussion of the risks and benefits of surgical intervention, the patient expressed understanding of the risks benefits and agree with plans for total knee arthroplasty.   The risks, benefits, and alternatives were discussed at length including but not limited to the risks of infection, bleeding, nerve injury, stiffness, blood clots, the need for revision surgery, cardiopulmonary complications, among others, and they were willing to proceed.  PROCEDURE IN DETAIL: The patient was brought into the operating room and, after adequate spinal anesthesia was achieved, a tourniquet was placed on  the patient's upper thigh. The patient's knee and leg were cleaned and prepped with alcohol and DuraPrep and draped in the usual sterile fashion. A "timeout" was performed as per usual protocol. The lower extremity was exsanguinated using an Esmarch, and the tourniquet was inflated to 300 mmHg. An anterior longitudinal incision was made followed by a standard mid vastus approach. The deep fibers of the medial collateral ligament were elevated in a subperiosteal fashion off of the medial flare of the tibia so as to maintain a continuous soft tissue sleeve. The patella was subluxed laterally and the patellofemoral ligament was incised. Inspection of the knee demonstrated severe degenerative changes with full-thickness loss of articular cartilage. Osteophytes were debrided using a rongeur. Anterior and posterior cruciate ligaments were excised. Two 4.0 mm Schanz pins were inserted in the femur and into the tibia for attachment of the array of trackers used for computer-assisted navigation. Hip center was identified using a circumduction technique. Distal landmarks were mapped using the computer. The distal femur and proximal tibia were mapped using the computer. The distal femoral cutting guide was positioned using computer-assisted navigation so as to achieve a 5 distal valgus cut. The femur was sized and it was felt that a size 5 femoral component was appropriate. A size 5 femoral cutting guide was positioned and the anterior cut was performed and verified using the computer. This was followed by completion of the posterior and chamfer cuts. Femoral cutting guide for the central box was then positioned in the center box cut was performed.  Attention was then directed to the proximal tibia. Medial and lateral menisci were excised. The extramedullary tibial cutting guide was positioned using computer-assisted navigation so as to achieve a 0 varus-valgus alignment and 3 posterior slope. The cut was performed and  verified using the computer. The proximal tibia was sized  and it was felt that a size 5 tibial tray was appropriate. Tibial and femoral trials were inserted followed by insertion of an 8 mm polyethylene insert. This allowed for excellent mediolateral soft tissue balancing both in flexion and in full extension. Finally, the patella was cut and prepared so as to accommodate a 38 mm medialized dome patella. A patella trial was placed and the knee was placed through a range of motion with excellent patellar tracking appreciated. The femoral trial was removed after debridement of posterior osteophytes. The central post-hole for the tibial component was reamed followed by insertion of a keel punch. Tibial trials were then removed. Cut surfaces of bone were irrigated with copious amounts of normal saline with antibiotic solution using pulsatile lavage and then suctioned dry. Polymethylmethacrylate cement was prepared in the usual fashion using a vacuum mixer. Cement was applied to the cut surface of the proximal tibia as well as along the undersurface of a size 5 rotating platform tibial component. Tibial component was positioned and impacted into place. Excess cement was removed using Personal assistant. Cement was then applied to the cut surfaces of the femur as well as along the posterior flanges of the size 5 femoral component. The femoral component was positioned and impacted into place. Excess cement was removed using Personal assistant. An 8 mm polyethylene trial was inserted and the knee was brought into full extension with steady axial compression applied. Finally, cement was applied to the backside of a 38 mm medialized dome patella and the patellar component was positioned and patellar clamp applied. Excess cement was removed using Personal assistant. After adequate curing of the cement, the tourniquet was deflated after a total tourniquet time of 106 minutes. Hemostasis was achieved using electrocautery. The knee was  irrigated with copious amounts of normal saline with antibiotic solution using pulsatile lavage and then suctioned dry. 20 mL of 1.3% Exparel and 60 mL of 0.25% Marcaine in 40 mL of normal saline was injected along the posterior capsule, medial and lateral gutters, and along the arthrotomy site. An 8 mm stabilized rotating platform polyethylene insert was inserted and the knee was placed through a range of motion with excellent mediolateral soft tissue balancing appreciated and excellent patellar tracking noted. 2 medium drains were placed in the wound bed and brought out through separate stab incisions. The medial parapatellar portion of the incision was reapproximated using interrupted sutures of #1 Vicryl. Subcutaneous tissue was approximated in layers using first #0 Vicryl followed #2-0 Vicryl. The skin was approximated with skin staples. A sterile dressing was applied.  The patient tolerated the procedure well and was transported to the recovery room in stable condition.    James P. Angie Fava., M.D.

## 2017-02-04 NOTE — Anesthesia Post-op Follow-up Note (Signed)
Anesthesia QCDR form completed.        

## 2017-02-05 ENCOUNTER — Encounter: Payer: Self-pay | Admitting: Orthopedic Surgery

## 2017-02-05 LAB — BASIC METABOLIC PANEL
ANION GAP: 5 (ref 5–15)
BUN: 12 mg/dL (ref 6–20)
CALCIUM: 8.5 mg/dL — AB (ref 8.9–10.3)
CO2: 27 mmol/L (ref 22–32)
Chloride: 106 mmol/L (ref 101–111)
Creatinine, Ser: 0.69 mg/dL (ref 0.44–1.00)
GFR calc Af Amer: >60 mL/min (ref >60)
GLUCOSE: 124 mg/dL — AB (ref 65–99)
Potassium: 4 mmol/L (ref 3.5–5.1)
Sodium: 138 mmol/L (ref 135–145)

## 2017-02-05 LAB — CBC
HCT: 30.6 % — ABNORMAL LOW (ref 35.0–47.0)
Hemoglobin: 10.5 g/dL — ABNORMAL LOW (ref 12.0–16.0)
MCH: 30.8 pg (ref 26.0–34.0)
MCHC: 34.3 g/dL (ref 32.0–36.0)
MCV: 89.8 fL (ref 80.0–100.0)
PLATELETS: 209 10*3/uL (ref 150–440)
RBC: 3.4 MIL/uL — ABNORMAL LOW (ref 3.80–5.20)
RDW: 13.4 % (ref 11.5–14.5)
WBC: 10.9 10*3/uL (ref 3.6–11.0)

## 2017-02-05 NOTE — Anesthesia Postprocedure Evaluation (Signed)
Anesthesia Post Note  Patient: Trish FountainJudy Pereyra  Procedure(s) Performed: Procedure(s) (LRB): COMPUTER ASSISTED TOTAL KNEE ARTHROPLASTY (Left)  Patient location during evaluation: Nursing Unit Anesthesia Type: Spinal Level of consciousness: awake, awake and alert, oriented and patient cooperative Pain management: pain level controlled Vital Signs Assessment: post-procedure vital signs reviewed and stable Respiratory status: spontaneous breathing, nonlabored ventilation and respiratory function stable Cardiovascular status: stable Postop Assessment: no headache, no backache, patient able to bend at knees, no signs of nausea or vomiting and adequate PO intake Anesthetic complications: no     Last Vitals:  Vitals:   02/05/17 0521 02/05/17 0735  BP: 140/65 (!) 177/72  Pulse: (!) 59 65  Resp:  16  Temp: 36.6 C (!) 36.3 C  SpO2: 95% 100%    Last Pain:  Vitals:   02/05/17 0920  TempSrc:   PainSc: 4                  Amantha Sklar,  Naythan Douthit R

## 2017-02-05 NOTE — Progress Notes (Signed)
  Subjective: 1 Day Post-Op Procedure(s) (LRB): COMPUTER ASSISTED TOTAL KNEE ARTHROPLASTY (Left) Patient reports pain as mild.   Patient seen in rounds with Dr. Ernest PineHooten. Patient is well, and has had no acute complaints or problems Plan is to go Home after hospital stay. Probable discharge home tomorrow. Negative for chest pain and shortness of breath Fever: no Gastrointestinal: Negative for nausea and vomiting  Objective: Vital signs in last 24 hours: Temp:  [97.8 F (36.6 C)-98.7 F (37.1 C)] 97.8 F (36.6 C) (09/06 0521) Pulse Rate:  [59-89] 59 (09/06 0521) Resp:  [12-18] 18 (09/05 2345) BP: (130-192)/(65-94) 140/65 (09/06 0521) SpO2:  [94 %-100 %] 95 % (09/06 0521) Weight:  [78 kg (172 lb)] 78 kg (172 lb) (09/05 0930)  Intake/Output from previous day:  Intake/Output Summary (Last 24 hours) at 02/05/17 0617 Last data filed at 02/05/17 0521  Gross per 24 hour  Intake          3758.33 ml  Output             1795 ml  Net          1963.33 ml    Intake/Output this shift: Total I/O In: 1198.3 [I.V.:1198.3] Out: 735 [Urine:575; Drains:160]  Labs:  Recent Labs  02/05/17 0409  HGB 10.5*    Recent Labs  02/05/17 0409  WBC 10.9  RBC 3.40*  HCT 30.6*  PLT 209    Recent Labs  02/05/17 0409  NA 138  K 4.0  CL 106  CO2 27  BUN 12  CREATININE 0.69  GLUCOSE 124*  CALCIUM 8.5*   No results for input(s): LABPT, INR in the last 72 hours.   EXAM General - Patient is Alert and Oriented Extremity - Sensation intact distally Dorsiflexion/Plantar flexion intact No cellulitis present Compartment soft Dressing/Incision - clean, dry, no drainage Motor Function - intact, moving foot and toes well on exam. Able to do a straight leg raise independently.  Past Medical History:  Diagnosis Date  . Arthritis   . Complication of anesthesia    slow to wake up after anesthesia  . GERD (gastroesophageal reflux disease)     Assessment/Plan: 1 Day Post-Op Procedure(s)  (LRB): COMPUTER ASSISTED TOTAL KNEE ARTHROPLASTY (Left) Active Problems:   S/P total knee arthroplasty  Estimated body mass index is 27.76 kg/m as calculated from the following:   Height as of 01/23/17: 5\' 6"  (1.676 m).   Weight as of this encounter: 78 kg (172 lb). Advance diet Up with therapy D/C IV fluids Plan for discharge tomorrow home.  DVT Prophylaxis - Lovenox, Foot Pumps and TED hose Weight-Bearing as tolerated to left leg  Dedra Skeensodd Kaho Selle, PA-C Orthopaedic Surgery 02/05/2017, 6:17 AM

## 2017-02-05 NOTE — Care Management Note (Signed)
Case Management Note  Patient Details  Name: Tanya Vazquez MRN: [id number] Date of Birth: April 05, 1946  Subjective/Objective: POD #1 left TKA. Met with patient at bedside to discuss discharge planning. Offered choice of home health agencies.Referral to [facility] for HHPT. Ordered a rolling walker from [place] with [facility]. Pharmacy: [facility] [place] [phone]. Called Lovenox 40 mg # 14 no refills. It is anticipated that patient will dishcarge tomorrow.                      Action/Plan:  [facility] for HHPT, Lovenox called in, Walker ordered from  [facility].   Expected Discharge Date:                  Expected Discharge Plan:  [place]  In-House Referral:     Discharge planning Services  CM Consult  Post Acute Care Choice:  Durable Medical Equipment, Home Health Choice offered to:  Patient  DME Arranged:  Walker rolling DME Agency:  [place]:  PT [place] Agency:  [facility]  Status of Service:  In process, will continue to follow  If discussed at Long Length of Stay Meetings, dates discussed:    Additional Comments:  Jolly Mango, RN 02/05/2017, 9:44 AM

## 2017-02-05 NOTE — Progress Notes (Signed)
Pt. Alert and oriented. POD 1. Up in chair with PT. No complaints at this time. Will continue to monitor.

## 2017-02-05 NOTE — Progress Notes (Signed)
Clinical Social Worker (CSW) received SNF consult. PT is recommending home health. RN case manager aware of above. Please reconsult if future social work needs arise. CSW signing off.   Tamarick Kovalcik, LCSW [phone] 

## 2017-02-05 NOTE — Progress Notes (Signed)
Pt alert and oriented. Tolerated bone foam throughout the night. Dangled at bedside without incident. Dsg to leg dry and intact with polar care in place. Hemovac in place and draining. Pt instructed to use incentive spirometer.  No n/v noted. VSS. Staff will continue to monitor pt.

## 2017-02-05 NOTE — Evaluation (Signed)
Physical Therapy Evaluation Patient Details Name: Tanya FountainJudy Vazquez MRN: [id number] DOB: 09-Mar-1946 Today's Date: 02/05/2017   History of Present Illness  Pt. is a [age] y.o. female who was [facility] for a Left [facility].  Clinical Impression  Pt admitted for L TKR, is currently post-op day 1 at time of eval. Pt able to perform bed mobility/transfers/ambulation with CGA and RW.  Pt able to perform SLR 10x on L leg, does not need a knee immobilizer. Pt demonstrates deficits in strength/pain/mobility. Pt appears motivated to participate in PT, demonstrating great AAROM this date. Pt would benefit from skilled PT during acute care stay to address above mentioned deficits. Recommend return to home with HHPT for follow up. Will continue to progress as able.     Follow Up Recommendations Home health PT    Equipment Recommendations  Rolling walker with 5" wheels    Recommendations for Other Services       Precautions / Restrictions Precautions Precautions: Knee;Fall Precaution Booklet Issued: No Restrictions Weight Bearing Restrictions: Yes LLE Weight Bearing: Weight bearing as tolerated      Mobility  Bed Mobility Overal bed mobility: Needs Assistance Bed Mobility: Supine to Sit     Supine to sit: Min guard     General bed mobility comments: Cues given for correct technique, able to initiate movement towards EOB, assist needed for guidance of LLE, once seated EOB, pt able to sit with upright posture.  Transfers Overall transfer level: Needs assistance Equipment used: Rolling walker (2 wheeled) Transfers: Sit to/from Stand Sit to Stand: Min guard         General transfer comment: cues given for hand placement prior to standing. Once standing pt able to WB on LLE, upright posture noted. RW used for assistance.  Ambulation/Gait Ambulation/Gait assistance: Min guard Ambulation Distance (Feet): 70 Feet Assistive device: Rolling walker (2 wheeled) Gait Pattern/deviations:  Step-through pattern     General Gait Details: Pt amb 70 feet with a 2 wheeled RW, using an alternating gait pattern with contact guard. Pt verbally cued to pivot feet and walker when turning around. Slow gait pattern noted, pt fatigued with increased distance.  Stairs            Wheelchair Mobility    Modified Rankin (Stroke Patients Only)       Balance Overall balance assessment: Needs assistance Sitting-balance support: Feet supported Sitting balance-Leahy Scale: Good     Standing balance support: Bilateral upper extremity supported Standing balance-Leahy Scale: Good                               Pertinent Vitals/Pain Pain Assessment: No/denies pain Pain Score: 2  Pain Location: L knee    Home Living Family/patient expects to be discharged to:: Private residence Living Arrangements: Spouse/significant other Available Help at Discharge: Family Type of Home: House Home Access: Stairs to enter Entrance Stairs-Rails: Right;Left;Can reach both Entrance Stairs-Number of Steps: 4 Home Layout: One level Home Equipment: Shower seat;Shower seat - built in      Prior Function Level of Independence: Independent      ADL's / Homemaking Assistance Needed: Pt. reports being very indepndent with ADls, and IADLs, and driving. Pt. is retired.        Hand Dominance   Dominant Hand: Right    Extremity/Trunk Assessment   Upper Extremity Assessment Upper Extremity Assessment: Overall WFL for tasks assessed    Lower Extremity Assessment Lower Extremity Assessment:  Generalized weakness (LLE was grossly 3/5: RLE WNL)       Communication   Communication: No difficulties  Cognition Arousal/Alertness: Awake/alert Behavior During Therapy: WFL for tasks assessed/performed Overall Cognitive Status: Within Functional Limits for tasks assessed                                        General Comments      Exercises Total Joint  Exercises Ankle Circles/Pumps: AROM;Left;10 reps Quad Sets: AROM;Left;10 reps Hip ABduction/ADduction: AROM;Left;10 reps Straight Leg Raises: AROM;Left;Right;10 reps Knee Flexion: AAROM;Left;10 reps Goniometric ROM: L knee AAROM 2-75 degrees   Assessment/Plan    PT Assessment Patient needs continued PT services  PT Problem List Decreased range of motion;Decreased strength;Decreased mobility;Decreased knowledge of use of DME;Decreased safety awareness;Pain       PT Treatment Interventions Gait training;Stair training;Therapeutic activities;Therapeutic exercise;Neuromuscular re-education;Balance training;Patient/family education;DME instruction    PT Goals (Current goals can be found in the Care Plan section)  Acute Rehab PT Goals Patient Stated Goal: To return home PT Goal Formulation: With patient Time For Goal Achievement: 02/19/17 Potential to Achieve Goals: Good Additional Goals Additional Goal #1: Pt will be able to perform bed mobility/transfers with supervision and RW in order to improve functional independence.    Frequency BID   Barriers to discharge        Co-evaluation               AM-PAC PT "6 Clicks" Daily Activity  Outcome Measure Difficulty turning over in bed (including adjusting bedclothes, sheets and blankets)?: None Difficulty moving from lying on back to sitting on the side of the bed? : Unable Difficulty sitting down on and standing up from a chair with arms (e.g., wheelchair, bedside commode, etc,.)?: Unable Help needed moving to and from a bed to chair (including a wheelchair)?: A Little Help needed walking in hospital room?: A Little Help needed climbing 3-5 steps with a railing? : A Little 6 Click Score: 15    End of Session Equipment Utilized During Treatment: Gait belt Activity Tolerance: Patient tolerated treatment well Patient left: in chair;Other (comment) (OT entered room at end of session) Nurse Communication: Mobility status PT  Visit Diagnosis: Other abnormalities of gait and mobility (R26.89);Pain;Muscle weakness (generalized) (M62.81);Difficulty in walking, not elsewhere classified (R26.2) Pain - Right/Left: Left Pain - part of body: Knee    Time: 1610-9604 PT Time Calculation (min) (ACUTE ONLY): 24 min   Charges:         PT G Codes:   PT G-Codes **NOT FOR INPATIENT CLASS** Functional Assessment Tool Used: AM-PAC 6 Clicks Basic Mobility Functional Limitation: Mobility: Walking and moving around Mobility: Walking and Moving Around Current Status (V4098): At least 40 percent but less than 60 percent impaired, limited or restricted Mobility: Walking and Moving Around Goal Status [phone]): At least 20 percent but less than 40 percent impaired, limited or restricted    Renford Dills, SPT Renford Dills 02/05/2017, 10:31 AM

## 2017-02-05 NOTE — Evaluation (Signed)
Occupational Therapy Evaluation Patient Details Name: Tanya Vazquez MRN: [id number] DOB: 10-Nov-1945 Today's Date: 02/05/2017    History of Present Illness Pt. is a 71 y.o. female who was admitted to [facility] for a Left TKR.   Clinical Impression   Pt. is 71 year old female s/p TKR.  Pt was independent in all ADLs, and IADLs prior to surgery and is eager to return to her PLOF.  Pt currently requires min assist for LE ADLs while in a seated position due to pain and limited AROM of the left knee.  Pt would benefit from continued instruction in dressing techniques with or without assistive devices for LE ADLs, functional mobility for ADLs/IADLs, and pt. education about home modification, and DME to assist pt. in returning to her prior level of function, and independence.     Follow Up Recommendations  No OT follow up    Equipment Recommendations       Recommendations for Other Services       Precautions / Restrictions Precautions Precautions: Knee Restrictions Weight Bearing Restrictions: Yes LLE Weight Bearing: Weight bearing as tolerated      Mobility Bed Mobility Overal bed mobility: Modified Independent                Transfers Overall transfer level: Modified independent                                                               ADL either performed or assessed with clinical judgement   ADL Overall ADL's : Needs assistance/impaired Eating/Feeding: Set up;Independent   Grooming: Set up;Independent   Upper Body Bathing: Set up;Independent   Lower Body Bathing: Set up;Minimal assistance   Upper Body Dressing : Set up;Independent   Lower Body Dressing: Minimal assistance               Functional mobility during ADLs: Modified independent General ADL Comments: Pt. education was provided about A/E use for LE ADLs.     Vision Baseline Vision/History: No visual deficits Patient Visual Report: No change from baseline        Perception     Praxis      Pertinent Vitals/Pain Pain Assessment: No/denies pain Pain Score: 2  Pain Location: L knee     Hand Dominance Right   Extremity/Trunk Assessment Upper Extremity Assessment Upper Extremity Assessment: Overall WFL for tasks assessed         Communication Communication Communication: No difficulties   Cognition Arousal/Alertness: Awake/alert Behavior During Therapy: WFL for tasks assessed/performed Overall Cognitive Status: Within Functional Limits for tasks assessed                                     General Comments       Exercises   Shoulder Instructions      Home Living Family/patient expects to be discharged to:: Private residence Living Arrangements: Spouse/significant other Available Help at Discharge: Family Type of Home: House Home Access: Stairs to enter Secretary/administrator of Steps: 4 Entrance Stairs-Rails:  (With rails) Home Layout: One level     Bathroom Shower/Tub: Walk-in shower;Door         Home Equipment: Shower seat;Shower seat - built in  Prior Functioning/Environment Level of Independence: Independent    ADL's / Homemaking Assistance Needed: Pt. reports being very indepndent with ADls, and IADLs, and driving. Pt. is retired.            OT Problem List: Decreased strength;Decreased range of motion;Pain      OT Treatment/Interventions: Self-care/ADL training;Therapeutic exercise;Patient/family education;Therapeutic activities;DME and/or AE instruction    OT Goals(Current goals can be found in the care plan section) Acute Rehab OT Goals Patient Stated Goal: To return home OT Goal Formulation: With patient Potential to Achieve Goals: Good  OT Frequency: Min 2X/week   Barriers to D/C:            Co-evaluation              AM-PAC PT "6 Clicks" Daily Activity     Outcome Measure Help from another person eating meals?: None Help from another person taking care of  personal grooming?: None Help from another person toileting, which includes using toliet, bedpan, or urinal?: A Little Help from another person bathing (including washing, rinsing, drying)?: A Little Help from another person to put on and taking off regular upper body clothing?: None Help from another person to put on and taking off regular lower body clothing?: A Little 6 Click Score: 21   End of Session    Activity Tolerance: Patient tolerated treatment well Patient left: in chair;with call bell/phone within reach;with chair alarm set  OT Visit Diagnosis: Muscle weakness (generalized) (M62.81)                Time: [id number] OT Time Calculation (min): 20 min Charges:  OT General Charges $OT Visit: 1 Visit OT Evaluation $OT Eval Moderate Complexity: 1 Mod G-Codes: OT G-codes **NOT FOR INPATIENT CLASS** Functional Limitation: Self care Self Care Current Status (V4098(G8987): At least 1 percent but less than 20 percent impaired, limited or restricted Self Care Goal Status (J1914(G8988): 0 percent impaired, limited or restricted   Olegario MessierElaine Raheem Kolbe, MS, OTR/L   Olegario MessierElaine Gailyn Crook, MS, OTR/L 02/05/2017, 9:52 AM

## 2017-02-05 NOTE — Discharge Instructions (Signed)

## 2017-02-05 NOTE — Progress Notes (Addendum)
Pt.  Stated she wasn't very hungry tonight but in bed resting now. Up with 1 assist with walker. Has gone to bathroom twice since foley removed, and tolerated well. Complains of moderate pain with movement. PRN pain meds given. Will continue to monitor.

## 2017-02-05 NOTE — Progress Notes (Signed)
Physical Therapy Treatment Patient Details Name: Tanya FountainJudy Pontarelli MRN: [id number] DOB: 17-Apr-1946 Today's Date: 02/05/2017    History of Present Illness Pt. is a 71 y.o. female who [facility].    PT Comments    Pt is making good progress towards goals with increased ambulation distance this session. Pt able to perform there-ex with safe technique and continues to be motivated to perform therapy. Needs to perform stair training prior to discharge.   Follow Up Recommendations  Home health PT     Equipment Recommendations  Rolling walker with 5" wheels    Recommendations for Other Services       Precautions / Restrictions Precautions Precautions: Knee;Fall Precaution Booklet Issued: Yes (comment) Restrictions Weight Bearing Restrictions: Yes LLE Weight Bearing: Weight bearing as tolerated    Mobility  Bed Mobility Overal bed mobility: Needs Assistance Bed Mobility: Supine to Sit     Supine to sit: Supervision     General bed mobility comments: safe technique performed with pt able to sit at EOB without issue.  Transfers Overall transfer level: Needs assistance Equipment used: Rolling walker (2 wheeled) Transfers: Sit to/from Stand Sit to Stand: Min guard         General transfer comment: upright posture noted with cues for hand placement. Once standing, RW used  Ambulation/Gait Ambulation/Gait assistance: Min guard Ambulation Distance (Feet): 220 Feet Assistive device: Rolling walker (2 wheeled) Gait Pattern/deviations: Step-through pattern     General Gait Details: ambulated with reciprocal gait pattern and upright posture. Able to keep RW moving throughout gait cycle.    Stairs            Wheelchair Mobility    Modified Rankin (Stroke Patients Only)       Balance                                            Cognition Arousal/Alertness: Awake/alert Behavior During Therapy: WFL for tasks  assessed/performed Overall Cognitive Status: Within Functional Limits for tasks assessed                                        Exercises Other Exercises Other Exercises: supine ther-ex performed and written HEP reviewed; including L LE ankle pumps, quad sets, SLRs, SAQ,  and hip abd/add. All ther-ex performed x 10 reps with cues for correct technique    General Comments        Pertinent Vitals/Pain Pain Assessment: 0-10 Pain Score: 1  Pain Location: L knee Pain Descriptors / Indicators: Operative site guarding Pain Intervention(s): Limited activity within patient's tolerance;Ice applied;Repositioned    Home Living Family/patient expects to be discharged to:: Private residence Living Arrangements: Spouse/significant other Available Help at Discharge: Family Type of Home: House Home Access: Stairs to enter Entrance Stairs-Rails: Right;Left;Can reach both Home Layout: One level Home Equipment: Shower seat;Shower seat - built in      Prior Function Level of Independence: Independent          PT Goals (current goals can now be found in the care plan section) Acute Rehab PT Goals Patient Stated Goal: To return home PT Goal Formulation: With patient Time For Goal Achievement: 02/19/17 Potential to Achieve Goals: Good Additional Goals Additional Goal #1: Pt will be able to perform  bed mobility/transfers with supervision and RW in order to improve functional independence. Progress towards PT goals: Progressing toward goals    Frequency    BID      PT Plan Current plan remains appropriate    Co-evaluation              AM-PAC PT "6 Clicks" Daily Activity  Outcome Measure  Difficulty turning over in bed (including adjusting bedclothes, sheets and blankets)?: None Difficulty moving from lying on back to sitting on the side of the bed? : Unable Difficulty sitting down on and standing up from a chair with arms (e.g., wheelchair, bedside commode,  etc,.)?: Unable Help needed moving to and from a bed to chair (including a wheelchair)?: None Help needed walking in hospital room?: None Help needed climbing 3-5 steps with a railing? : A Little 6 Click Score: 17    End of Session Equipment Utilized During Treatment: Gait belt Activity Tolerance: Patient tolerated treatment well Patient left: in bed;with bed alarm set;with SCD's reapplied Nurse Communication: Mobility status PT Visit Diagnosis: Other abnormalities of gait and mobility (R26.89);Pain;Muscle weakness (generalized) (M62.81);Difficulty in walking, not elsewhere classified (R26.2) Pain - Right/Left: Left Pain - part of body: Knee     Time: 1317-1340 PT Time Calculation (min) (ACUTE ONLY): 23 min  Charges:  $Gait Training: 8-22 mins $Therapeutic Exercise: 8-22 mins                    G Codes:       Elizabeth Palau, PT, DPT [phone]    Rie Mcneil 02/05/2017, 1:47 PM

## 2017-02-06 LAB — BASIC METABOLIC PANEL
ANION GAP: 6 (ref 5–15)
BUN: 12 mg/dL (ref 6–20)
CALCIUM: 8.5 mg/dL — AB (ref 8.9–10.3)
CO2: 29 mmol/L (ref 22–32)
CREATININE: 0.71 mg/dL (ref 0.44–1.00)
Chloride: 104 mmol/L (ref 101–111)
GLUCOSE: 124 mg/dL — AB (ref 65–99)
Potassium: 3.5 mmol/L (ref 3.5–5.1)
Sodium: 139 mmol/L (ref 135–145)

## 2017-02-06 LAB — CBC
HCT: 29.9 % — ABNORMAL LOW (ref 35.0–47.0)
Hemoglobin: 10.3 g/dL — ABNORMAL LOW (ref 12.0–16.0)
MCH: 31.5 pg (ref 26.0–34.0)
MCHC: 34.5 g/dL (ref 32.0–36.0)
MCV: 91.4 fL (ref 80.0–100.0)
PLATELETS: 183 10*3/uL (ref 150–440)
RBC: 3.27 MIL/uL — ABNORMAL LOW (ref 3.80–5.20)
RDW: 13.3 % (ref 11.5–14.5)
WBC: 6.3 10*3/uL (ref 3.6–11.0)

## 2017-02-06 MED ORDER — OXYCODONE HCL 5 MG PO TABS: 5.0000 mg | ORAL_TABLET | ORAL | 0 refills | Status: DC | PRN

## 2017-02-06 MED ORDER — TRAMADOL HCL 50 MG PO TABS: 50.0000 mg | ORAL_TABLET | ORAL | 1 refills | Status: DC | PRN

## 2017-02-06 MED ORDER — ENOXAPARIN SODIUM 40 MG/0.4ML ~~LOC~~ SOLN: 40.0000 mg | SUBCUTANEOUS | 0 refills | Status: DC

## 2017-02-06 NOTE — Progress Notes (Signed)
Patient is being [facility]. IV removed by Tami student nurse and belongings packed. DC and RX instructions given by nurse. Patient acknowledged understanding and had no further questions. Husband here to take her home.

## 2017-02-06 NOTE — Care Management Important Message (Signed)
Important Message  Patient Details  Name: Tanya FountainJudy Vazquez MRN: [id number] Date of Birth: 02-12-1946   Medicare Important Message Given:  N/A - LOS <3 / Initial given by admissions    Marily MemosLisa M Genette Huertas, RN 02/06/2017, 8:46 AM

## 2017-02-06 NOTE — Progress Notes (Signed)
Physical Therapy Treatment Patient Details Name: Tanya Vazquez MRN: [id number] DOB: 1946-01-23 Today's Date: 02/06/2017    History of Present Illness Pt. is a 71 y.o. female who was admitted to [facility] for a Left TKR.    PT Comments    Pt is making good progress with goals, able to amb greater distance with RW with greater endurance. Pt completed stairs, 4 steps with step-to gait pattern with both hands on R rail. Verbal cues given for proper hand placement throughout and with RW positioning. Pt and family members educated on proper technique for safe car transfers. Answered all pt and family questions regarding transition back to home. Pt appears motivated to progress with HEP and return to home.   Follow Up Recommendations  Home health PT     Equipment Recommendations  Rolling walker with 5" wheels    Recommendations for Other Services       Precautions / Restrictions Precautions Precautions: Knee;Fall Precaution Booklet Issued: Yes (comment) Restrictions Weight Bearing Restrictions: Yes LLE Weight Bearing: Weight bearing as tolerated    Mobility  Bed Mobility Overal bed mobility: Needs Assistance Bed Mobility: Supine to Sit     Supine to sit: Supervision     General bed mobility comments: Pt able to initiate movement and seat herself at EOB safely with no assistance.  Transfers Overall transfer level: Needs assistance Equipment used: Rolling walker (2 wheeled) Transfers: Sit to/from Stand Sit to Stand: Min guard         General transfer comment: Pt able to safely/properly position herself to perform transfers with RW.   Ambulation/Gait Ambulation/Gait assistance: Min guard Ambulation Distance (Feet): 320 Feet Assistive device: Rolling walker (2 wheeled) Gait Pattern/deviations: Step-through pattern Gait velocity: 1.25 ft/sec Gait velocity interpretation: <1.8 ft/sec, indicative of risk for recurrent falls General Gait Details: Pt amb with reciprocal gait  pattern, upright posture, and head up looking ahead. Able to keep RW walking and did not require cues for turns.  Pt did not appear fatigued at end of session.   Stairs Stairs: Yes   Stair Management: Step to pattern;One rail Right Number of Stairs: 4 General stair comments: Pt able to ascend/descend stairs with verbal cues for proper hand placement on rail.   Wheelchair Mobility    Modified Rankin (Stroke Patients Only)       Balance                                            Cognition Arousal/Alertness: Awake/alert Behavior During Therapy: WFL for tasks assessed/performed Overall Cognitive Status: Within Functional Limits for tasks assessed                                        Exercises Total Joint Exercises Goniometric ROM: L knee AAROM 0-92 degrees Other Exercises Other Exercises: supine ther-ex performed; L ankle pumps, quad sets, SLRs, SAQ, hip abd/add, introduced seated LAQ, cued to lift leg no higher than knee level. Performed seated heel slides with towel under foot, provided slight overpressure during knee flexion measurement. All ther-ex performed 12x with cues for proper technique.    General Comments        Pertinent Vitals/Pain Pain Assessment: Faces Pain Score: 2  Faces Pain Scale: Hurts a little bit Pain Location: L knee Pain Descriptors /  Indicators: Operative site guarding Pain Intervention(s): Limited activity within patient's tolerance;Monitored during session;Repositioned;Ice applied    Home Living                      Prior Function            PT Goals (current goals can now be found in the care plan section) Acute Rehab PT Goals Patient Stated Goal: To return home PT Goal Formulation: With patient Time For Goal Achievement: 02/19/17 Potential to Achieve Goals: Good Additional Goals Additional Goal #1: Pt will be able to perform bed mobility/transfers with supervision and RW in order to  improve functional independence. Progress towards PT goals: Goals met and updated - see care plan    Frequency    BID      PT Plan Current plan remains appropriate    Co-evaluation              AM-PAC PT "6 Clicks" Daily Activity  Outcome Measure  Difficulty turning over in bed (including adjusting bedclothes, sheets and blankets)?: None Difficulty moving from lying on back to sitting on the side of the bed? : None Difficulty sitting down on and standing up from a chair with arms (e.g., wheelchair, bedside commode, etc,.)?: Unable Help needed moving to and from a bed to chair (including a wheelchair)?: None Help needed walking in hospital room?: None Help needed climbing 3-5 steps with a railing? : A Little 6 Click Score: 20    End of Session Equipment Utilized During Treatment: Gait belt Activity Tolerance: Patient tolerated treatment well Patient left: in chair;with call bell/phone within reach;with chair alarm set Nurse Communication: Mobility status PT Visit Diagnosis: Other abnormalities of gait and mobility (R26.89);Muscle weakness (generalized) (M62.81);Pain;Difficulty in walking, not elsewhere classified (R26.2) Pain - Right/Left: Right Pain - part of body: Knee     Time: 1020-1051 PT Time Calculation (min) (ACUTE ONLY): 31 min  Charges:  $Gait Training: 8-22 mins $Therapeutic Exercise: 8-22 mins                    G Codes:       Manfred Arch, SPT   Manfred Arch 02/06/2017, 12:05 PM

## 2017-02-06 NOTE — Care Management Note (Signed)
Case Management Note  Patient Details  Name: Tanya Vazquez MRN: [id number] Date of Birth: 1946-01-08  Subjective/Objective: Discharging today                   Action/Plan: Cost of Lovenox is $64.00. Patient updated and denies issues paying for medication. [facility] notified of discharge. DME delivered.    Expected Discharge Date:  02/06/17               Expected Discharge Plan:  Home w Home Health Services  In-House Referral:     Discharge planning Services  CM Consult  Post Acute Care Choice:  Durable Medical Equipment, Home Health Choice offered to:  Patient  DME Arranged:  Walker rolling DME Agency:  [facility]  HH Arranged:  PT HH Agency[facility]  [facility]  Status of Service:  Completed, signed off  If discussed at [employer] Length of Stay Meetings, dates discussed:    Additional Comments:  Marily MemosLisa M Saidi Santacroce, RN 02/06/2017, 8:18 AM

## 2017-02-06 NOTE — Discharge Summary (Signed)
Physician Discharge Summary  Subjective: 2 Days Post-Op Procedure(s) (LRB): COMPUTER ASSISTED TOTAL KNEE ARTHROPLASTY (Left) Patient reports pain as mild.   Patient seen in rounds with Dr. Ernest PineHooten. Patient is well, and has had no acute complaints or problems Patient is ready to go home with home health physical therapy.  Physician Discharge Summary  Patient ID: Tanya Vazquez Situ MRN: [id number] DOB/AGE: Sep 30, 1945 71 y.o.  Admit date: 02/04/2017 Discharge date: 02/06/2017  Admission Diagnoses:  Discharge Diagnoses:  Active Problems:   S/P total knee arthroplasty   Discharged Condition: good  Hospital Course: The patient is postop day 2 from a left total knee replacement. The patient has done very well since surgery. She ambulated 220 feet with physical therapy yesterday. She is able to do a straight leg raise independently. Her pain control is stable. The patient has had a bowel movement. Her labs are stable. She is ready to go home with home health physical therapy.  Treatments: surgery:   Left total knee arthroplasty using computer-assisted navigation  SURGEON:  Jena GaussJames P Hooten, Jr. M.D.  ASSISTANT:  Dedra Skeensodd Mundy, PA-C (present and scrubbed throughout the case, critical for assistance with exposure, retraction, instrumentation, and closure)  ANESTHESIA: spinal  ESTIMATED BLOOD LOSS: 50 mL  FLUIDS REPLACED: 1650 mL of crystalloid  TOURNIQUET TIME: 106 minutes  DRAINS: 2 medium Hemovac drains  SOFT TISSUE RELEASES: Anterior cruciate ligament, posterior cruciate ligament, deep medial collateral ligament, patellofemoral ligament  IMPLANTS UTILIZED: DePuy Attune size 5 posterior stabilized femoral component (cemented), size 5 rotating platform tibial component (cemented), 38 mm medialized dome patella (cemented), and a 8 mm stabilized rotating platform polyethylene insert.  Discharge Exam: Blood pressure (!) 154/72, pulse 89, temperature 98.1 F (36.7 C), temperature source  Oral, resp. rate 18, height 5\' 6"  (1.676 m), weight 78 kg (172 lb), SpO2 98 %.   Disposition: Final discharge disposition not confirmed    Allergies as of 02/06/2017   No Known Allergies     Medication List    TAKE these medications   acetaminophen 325 MG tablet Commonly known as:  TYLENOL Take 650 mg by mouth every 4 (four) hours as needed for mild pain or moderate pain.   enoxaparin 40 MG/0.4ML injection Commonly known as:  LOVENOX Inject 0.4 mLs (40 mg total) into the skin daily.   ibuprofen 200 MG tablet Commonly known as:  ADVIL,MOTRIN Take 400 mg by mouth 3 (three) times daily as needed for mild pain.   oxyCODONE 5 MG immediate release tablet Commonly known as:  Oxy IR/ROXICODONE Take 1-2 tablets (5-10 mg total) by mouth every 4 (four) hours as needed for severe pain or breakthrough pain.   traMADol 50 MG tablet Commonly known as:  ULTRAM Take 1-2 tablets (50-100 mg total) by mouth every 4 (four) hours as needed for moderate pain.            Durable Medical Equipment        Start     Ordered   02/04/17 1644  DME Walker rolling  Once    Question:  Patient needs a walker to treat with the following condition  Answer:  Total knee replacement status   02/04/17 1644   02/04/17 1644  DME Bedside commode  Once    Question:  Patient needs a bedside commode to treat with the following condition  Answer:  Total knee replacement status   02/04/17 1644       Discharge Care Instructions        Start  Ordered   02/06/17 0000  oxyCODONE (OXY IR/ROXICODONE) 5 MG immediate release tablet  Every 4 hours PRN     02/06/17 0615   02/06/17 0000  traMADol (ULTRAM) 50 MG tablet  Every 4 hours PRN     02/06/17 0615   02/06/17 0000  enoxaparin (LOVENOX) 40 MG/0.4ML injection  Every 24 hours     02/06/17 0615     Follow-up Information    Evon Slack, PA-C Follow up on 02/20/2017.   Specialties:  Orthopedic Surgery, Emergency Medicine Why:  at 10:45am Contact  information: [address] [place] [address] [phone]        Donato Heinz, MD Follow up on 03/17/2017.   Specialty:  Orthopedic Surgery Why:  at 1:45pm Contact information: [address] [facility] [place] [address] [phone]           Signed: Lenard Forth, TODD 02/06/2017, 6:17 AM   Objective: Vital signs in last 24 hours: Temp:  [97.4 F (36.3 C)-98.1 F (36.7 C)] 98.1 F (36.7 C) (09/06 1921) Pulse Rate:  [65-89] 89 (09/06 1921) Resp:  [16-18] 18 (09/06 1921) BP: (150-177)/(72-75) 154/72 (09/06 1921) SpO2:  [98 %-100 %] 98 % (09/06 1921)  Intake/Output from previous day:  Intake/Output Summary (Last 24 hours) at 02/06/17 0617 Last data filed at 02/06/17 0422  Gross per 24 hour  Intake              995 ml  Output             2140 ml  Net            -1145 ml    Intake/Output this shift: Total I/O In: 0  Out: 1290 [Urine:1200; Drains:90]  Labs:  Recent Labs  02/05/17 0409 02/06/17 0422  HGB 10.5* 10.3*    Recent Labs  02/05/17 0409 02/06/17 0422  WBC 10.9 6.3  RBC 3.40* 3.27*  HCT 30.6* 29.9*  PLT 209 183    Recent Labs  02/05/17 0409 02/06/17 0422  NA 138 139  K 4.0 3.5  CL 106 104  CO2 27 29  BUN 12 12  CREATININE 0.69 0.71  GLUCOSE 124* 124*  CALCIUM 8.5* 8.5*   No results for input(s): LABPT, INR in the last 72 hours.  EXAM: General - Patient is Alert and Oriented Extremity - Neurovascular intact Dorsiflexion/Plantar flexion intact No cellulitis present Compartment soft Incision - clean, dry, no drainage Motor Function -  plantarflexion and dorsiflexion is intact. Able to do a straight leg raise.  Assessment/Plan: 2 Days Post-Op Procedure(s) (LRB): COMPUTER ASSISTED TOTAL KNEE ARTHROPLASTY (Left) Procedure(s) (LRB): COMPUTER ASSISTED TOTAL KNEE ARTHROPLASTY (Left) Past Medical History:  Diagnosis Date  . Arthritis   . Complication of anesthesia    slow to wake up after anesthesia   . GERD (gastroesophageal reflux disease)    Active Problems:   S/P total knee arthroplasty  Estimated body mass index is 27.76 kg/m as calculated from the following:   Height as of this encounter:  (1.676 m).   Weight as of this encounter: 78 kg (172 lb). Discharge home with home health Diet - Regular diet Follow up - in 2 weeks Activity - WBAT Disposition - Home Condition Upon Discharge - Good DVT Prophylaxis - Lovenox and TED hose  Dedra Skeens, PA-C Orthopaedic Surgery 02/06/2017, 6:17 AM

## 2017-02-06 NOTE — Progress Notes (Signed)
  Subjective: 2 Days Post-Op Procedure(s) (LRB): COMPUTER ASSISTED TOTAL KNEE ARTHROPLASTY (Left) Patient reports pain as mild.   Patient seen in rounds with Dr. Ernest PineHooten. Patient is well, and has had no acute complaints or problems Plan is to go Home after hospital stay. Discharge home today. Negative for chest pain and shortness of breath Fever: no Gastrointestinal: Negative for nausea and vomiting  Objective: Vital signs in last 24 hours: Temp:  [97.4 F (36.3 C)-98.1 F (36.7 C)] 98.1 F (36.7 C) (09/06 1921) Pulse Rate:  [65-89] 89 (09/06 1921) Resp:  [16-18] 18 (09/06 1921) BP: (150-177)/(72-75) 154/72 (09/06 1921) SpO2:  [98 %-100 %] 98 % (09/06 1921)  Intake/Output from previous day:  Intake/Output Summary (Last 24 hours) at 02/06/17 0612 Last data filed at 02/06/17 0422  Gross per 24 hour  Intake              995 ml  Output             2140 ml  Net            -1145 ml    Intake/Output this shift: Total I/O In: 0  Out: 1290 [Urine:1200; Drains:90]  Labs:  Recent Labs  02/05/17 0409 02/06/17 0422  HGB 10.5* 10.3*    Recent Labs  02/05/17 0409 02/06/17 0422  WBC 10.9 6.3  RBC 3.40* 3.27*  HCT 30.6* 29.9*  PLT 209 183    Recent Labs  02/05/17 0409 02/06/17 0422  NA 138 139  K 4.0 3.5  CL 106 104  CO2 27 29  BUN 12 12  CREATININE 0.69 0.71  GLUCOSE 124* 124*  CALCIUM 8.5* 8.5*   No results for input(s): LABPT, INR in the last 72 hours.   EXAM General - Patient is Alert and Oriented Extremity - Sensation intact distally Dorsiflexion/Plantar flexion intact No cellulitis present Compartment soft Dressing/Incision - clean, dry, With surgical bandage removed. Motor Function - intact, moving foot and toes well on exam. Able to do a straight leg raise independently. Ambulated 220 feet.  Past Medical History:  Diagnosis Date  . Arthritis   . Complication of anesthesia    slow to wake up after anesthesia  . GERD (gastroesophageal reflux  disease)     Assessment/Plan: 2 Days Post-Op Procedure(s) (LRB): COMPUTER ASSISTED TOTAL KNEE ARTHROPLASTY (Left) Active Problems:   S/P total knee arthroplasty  Estimated body mass index is 27.76 kg/m as calculated from the following:   Height as of this encounter: 5\' 6"  (1.676 m).   Weight as of this encounter: 78 kg (172 lb). Advance diet Up with therapy D/C IV fluids Plan for discharge tomorrow today. The patient had a bowel movement.  DVT Prophylaxis - Lovenox, Foot Pumps and TED hose Weight-Bearing as tolerated to left leg  Dedra Skeensodd Lorean Ekstrand, PA-C Orthopaedic Surgery 02/06/2017, 6:12 AM

## 2017-05-28 ENCOUNTER — Encounter
Admission: RE | Admit: 2017-05-28 | Discharge: 2017-05-28 | Disposition: A | Discharge status: Home / Self Care | Payer: Medicare Other | Source: Ambulatory Visit | Attending: Orthopedic Surgery | Admitting: Orthopedic Surgery

## 2017-05-28 ENCOUNTER — Other Ambulatory Visit: Payer: Self-pay

## 2017-05-28 DIAGNOSIS — K219 Gastro-esophageal reflux disease without esophagitis: Secondary | ICD-10-CM | POA: Diagnosis not present

## 2017-05-28 DIAGNOSIS — Z01812 Encounter for preprocedural laboratory examination: Secondary | ICD-10-CM | POA: Insufficient documentation

## 2017-05-28 DIAGNOSIS — T8859XS Other complications of anesthesia, sequela: Secondary | ICD-10-CM | POA: Diagnosis not present

## 2017-05-28 DIAGNOSIS — M1990 Unspecified osteoarthritis, unspecified site: Secondary | ICD-10-CM | POA: Insufficient documentation

## 2017-05-28 LAB — SURGICAL PCR SCREEN
MRSA, PCR: NEGATIVE
STAPHYLOCOCCUS AUREUS: NEGATIVE

## 2017-05-28 LAB — COMPREHENSIVE METABOLIC PANEL
ALBUMIN: 4.3 g/dL (ref 3.5–5.0)
ALT: 12 U/L — ABNORMAL LOW (ref 14–54)
ANION GAP: 7 (ref 5–15)
AST: 19 U/L (ref 15–41)
Alkaline Phosphatase: 60 U/L (ref 38–126)
BUN: 12 mg/dL (ref 6–20)
CHLORIDE: 103 mmol/L (ref 101–111)
CO2: 28 mmol/L (ref 22–32)
Calcium: 9.4 mg/dL (ref 8.9–10.3)
Creatinine, Ser: 0.66 mg/dL (ref 0.44–1.00)
GFR calc Af Amer: >60 mL/min (ref >60)
GFR calc non Af Amer: >60 mL/min (ref >60)
GLUCOSE: 92 mg/dL (ref 65–99)
POTASSIUM: 3.5 mmol/L (ref 3.5–5.1)
SODIUM: 138 mmol/L (ref 135–145)
TOTAL PROTEIN: 7.2 g/dL (ref 6.5–8.1)
Total Bilirubin: 0.5 mg/dL (ref 0.3–1.2)

## 2017-05-28 LAB — CBC
HCT: 37.9 % (ref 35.0–47.0)
Hemoglobin: 12.3 g/dL (ref 12.0–16.0)
MCH: 28.9 pg (ref 26.0–34.0)
MCHC: 32.4 g/dL (ref 32.0–36.0)
MCV: 89.2 fL (ref 80.0–100.0)
PLATELETS: 233 10*3/uL (ref 150–440)
RBC: 4.24 MIL/uL (ref 3.80–5.20)
RDW: 14.6 % — ABNORMAL HIGH (ref 11.5–14.5)
WBC: 5.5 10*3/uL (ref 3.6–11.0)

## 2017-05-28 LAB — URINALYSIS, ROUTINE W REFLEX MICROSCOPIC
BILIRUBIN URINE: NEGATIVE
Glucose, UA: NEGATIVE mg/dL
HGB URINE DIPSTICK: NEGATIVE
Ketones, ur: NEGATIVE mg/dL
Leukocytes, UA: NEGATIVE
Nitrite: NEGATIVE
Protein, ur: NEGATIVE mg/dL
SPECIFIC GRAVITY, URINE: 1.006 (ref 1.005–1.030)
pH: 5 (ref 5.0–8.0)

## 2017-05-28 LAB — PROTIME-INR
INR: 0.88
Prothrombin Time: 11.9 seconds (ref 11.4–15.2)

## 2017-05-28 LAB — TYPE AND SCREEN
ABO/RH(D): O POS
ANTIBODY SCREEN: NEGATIVE

## 2017-05-28 LAB — C-REACTIVE PROTEIN

## 2017-05-28 LAB — SEDIMENTATION RATE: SED RATE: 17 mm/h (ref 0–30)

## 2017-05-28 LAB — APTT: APTT: 35 s (ref 24–36)

## 2017-05-28 NOTE — Pre-Procedure Instructions (Addendum)
Patient had abnormal EKG prior to left TKR 01/2017, was seen by cardiology and cleared for that surgery. Copy of that clearance is on chart. Patient continues to deny any types of symptoms except elevated BP whenever in clinical setting. BP at home per patient 140's systolic.

## 2017-05-28 NOTE — Patient Instructions (Signed)
Your procedure is scheduled on: Monday 06/08/17 Report to DAY SURGERY. 2ND FLOOR MEDICAL MALL ENTRANCE. To find out your arrival time please call [phone] between 1PM - 3PM on Friday 06/05/17.  Remember: Instructions that are not followed completely may result in serious medical risk, up to and including death, or upon the discretion of your surgeon and anesthesiologist your surgery may need to be rescheduled.    __X__ 1. Do not eat anything after midnight the night before your    procedure.  No gum chewing or hard candies.  You may drink clear   liquids up to 2 hours before you are scheduled to arrive at the   hospital for your procedure. Do not drink clear liquids within 2   hours of scheduled arrival to the hospital as this may lead to your   procedure being delayed or rescheduled.       Clear liquids include:   Water or Apple juice without pulp   Clear carbohydrate beverage such as Clearfast or Gatorade   Black coffee or Clear Tea (no milk, no creamer, do not add anything   to the coffee or tea)    Diabetics should only drink water   __X__ 2. No Alcohol for 24 hours before or after surgery.   ____ 3. Bring all medications with you on the day of surgery if instructed.    __X__ 4. Notify your doctor if there is any change in your medical condition     (cold, fever, infections).             __X___5. No smoking within 24 hours of your surgery.     Do not wear jewelry, make-up, hairpins, clips or nail polish.  Do not wear lotions, powders, or perfumes.   Do not shave 48 hours prior to surgery. Men may shave face and neck.  Do not bring valuables to the hospital.    [facility] any belongings or valuables.               Contacts, dentures or bridgework may not be worn into surgery.  Leave your suitcase in the car. After surgery it may be brought to your room.  For patients admitted to the hospital, discharge time is determined by your                treatment  team.   Patients discharged the day of surgery will not be allowed to drive home.   Please read over the following fact sheets that you were given:   MRSA Information   ____ Take these medicines the morning of surgery with A SIP OF WATER:    1. NONE  2.   3.   4.  5.  6.  ____ Fleet Enema (as directed)   __X__ Use CHG Soap/SAGE wipes as directed  ____ Use inhalers on the day of surgery  ____ Stop metformin 2 days prior to surgery    ____ Take 1/2 of usual insulin dose the night before surgery and none on the morning of surgery.   ____ Stop Coumadin/Plavix/aspirin on   __X__ Stop Anti-inflammatories such as Advil, Aleve, Ibuprofen, Motrin, Naproxen, Naprosyn, Goodies,powder, or aspirin products.  OK to take Tylenol.   __X__ Stop supplements, Vitamin E, Fish Oil until after surgery.  STOP DIUREX AND HEMP 1 WEEK PRIOR TO SURGERY  ____ Bring C-Pap to the hospital.

## 2017-05-29 LAB — URINE CULTURE
Culture: <10000 — AB
Special Requests: NORMAL

## 2017-06-07 MED ORDER — CEFAZOLIN SODIUM-DEXTROSE 2-4 GM/100ML-% IV SOLN: 2.0000 g | INTRAVENOUS | Status: DC

## 2017-06-07 MED ORDER — SODIUM CHLORIDE 0.9 % IV SOLN
1000.0000 mg | INTRAVENOUS | Status: DC
  Filled 2017-06-07: qty 10

## 2017-06-07 NOTE — Discharge Instructions (Signed)
°  Instructions after Total Knee Replacement ° ° Itzia Cunliffe P. Ayari Liwanag, Jr., M.D.    ° Dept. of Orthopaedics & Sports Medicine ° [facility] ° [address] ° [place], [place]  [address] ° Phone: [phone]   Fax: [phone] ° °  °DIET: °• Drink plenty of non-alcoholic fluids. °• Resume your normal diet. Include foods high in fiber. ° °ACTIVITY:  °• You may use crutches or a walker with weight-bearing as tolerated, unless instructed otherwise. °• You may be weaned off of the walker or crutches by your Physical Therapist.  °• Do NOT place pillows under the knee. Anything placed under the knee could limit your ability to straighten the knee.   °• Continue doing gentle exercises. Exercising will reduce the pain and swelling, increase motion, and prevent muscle weakness.   °• Please continue to use the TED compression stockings for 6 weeks. You may remove the stockings at night, but should reapply them in the morning. °• Do not drive or operate any equipment until instructed. ° °WOUND CARE:  °• Continue to use the PolarCare or ice packs periodically to reduce pain and swelling. °• You may bathe or shower after the staples are removed at the first office visit following surgery. ° °MEDICATIONS: °• You may resume your regular medications. °• Please take the pain medication as prescribed on the medication. °• Do not take pain medication on an empty stomach. °• You have been given a prescription for a blood thinner (Lovenox or Coumadin). Please take the medication as instructed. (NOTE: After completing a 2 week course of Lovenox, take one Enteric-coated aspirin once a day. This along with elevation will help reduce the possibility of phlebitis in your operated leg.) °• Do not drive or drink alcoholic beverages when taking pain medications. ° °CALL THE OFFICE FOR: °• Temperature above 101 degrees °• Excessive bleeding or drainage on the dressing. °• Excessive swelling, coldness, or paleness of the toes. °• Persistent  nausea and vomiting. ° °FOLLOW-UP:  °• You should have an appointment to return to the office in 10-14 days after surgery. °• Arrangements have been made for continuation of Physical Therapy (either home therapy or outpatient therapy). °  °

## 2017-06-08 ENCOUNTER — Inpatient Hospital Stay: Payer: Medicare Other | Admitting: Certified Registered"

## 2017-06-08 ENCOUNTER — Encounter
Admission: RE | Disposition: A | Discharge status: Home Health | Payer: Self-pay | Source: Ambulatory Visit | Attending: Orthopedic Surgery

## 2017-06-08 ENCOUNTER — Other Ambulatory Visit: Payer: Self-pay

## 2017-06-08 ENCOUNTER — Encounter: Payer: Self-pay | Admitting: Orthopedic Surgery

## 2017-06-08 ENCOUNTER — Inpatient Hospital Stay: Payer: Medicare Other

## 2017-06-08 ENCOUNTER — Inpatient Hospital Stay
Admission: RE | Admit: 2017-06-08 | Discharge: 2017-06-10 | DRG: 470 | Disposition: A | Discharge status: Home Health | Payer: Medicare Other | Source: Ambulatory Visit | Attending: Orthopedic Surgery | Admitting: Orthopedic Surgery

## 2017-06-08 DIAGNOSIS — Z96652 Presence of left artificial knee joint: Secondary | ICD-10-CM

## 2017-06-08 DIAGNOSIS — M25761 Osteophyte, right knee: Secondary | ICD-10-CM | POA: Diagnosis present

## 2017-06-08 DIAGNOSIS — Z96659 Presence of unspecified artificial knee joint: Secondary | ICD-10-CM

## 2017-06-08 DIAGNOSIS — M25561 Pain in right knee: Secondary | ICD-10-CM | POA: Diagnosis present

## 2017-06-08 DIAGNOSIS — K219 Gastro-esophageal reflux disease without esophagitis: Secondary | ICD-10-CM | POA: Diagnosis present

## 2017-06-08 DIAGNOSIS — M1711 Unilateral primary osteoarthritis, right knee: Principal | ICD-10-CM | POA: Diagnosis present

## 2017-06-08 HISTORY — PX: KNEE ARTHROPLASTY: SHX992

## 2017-06-08 SURGERY — ARTHROPLASTY, KNEE, TOTAL, USING IMAGELESS COMPUTER-ASSISTED NAVIGATION
Anesthesia: Spinal | Site: Knee | Laterality: Right | Wound class: Clean

## 2017-06-08 MED ORDER — FENTANYL CITRATE (PF) 100 MCG/2ML IJ SOLN
INTRAMUSCULAR | Status: AC
  Filled 2017-06-08: qty 2

## 2017-06-08 MED ORDER — LIDOCAINE HCL (PF) 2 % IJ SOLN
INTRAMUSCULAR | Status: DC | PRN
  Administered 2017-06-08: 50 mg

## 2017-06-08 MED ORDER — DEXAMETHASONE SODIUM PHOSPHATE 10 MG/ML IJ SOLN
INTRAMUSCULAR | Status: AC
  Filled 2017-06-08: qty 1

## 2017-06-08 MED ORDER — NEOMYCIN-POLYMYXIN B GU 40-200000 IR SOLN
Status: DC | PRN
  Administered 2017-06-08: 14 mL

## 2017-06-08 MED ORDER — CHLORHEXIDINE GLUCONATE 4 % EX LIQD: 60.0000 mL | Freq: Once | CUTANEOUS | Status: DC

## 2017-06-08 MED ORDER — CEFAZOLIN SODIUM-DEXTROSE 2-4 GM/100ML-% IV SOLN
INTRAVENOUS | Status: AC
  Filled 2017-06-08: qty 100

## 2017-06-08 MED ORDER — LIDOCAINE HCL (PF) 2 % IJ SOLN
INTRAMUSCULAR | Status: AC
  Filled 2017-06-08: qty 10

## 2017-06-08 MED ORDER — SODIUM CHLORIDE 0.9 % IV SOLN
INTRAVENOUS | Status: DC | PRN
  Administered 2017-06-08: 60 mL

## 2017-06-08 MED ORDER — OXYCODONE HCL 5 MG PO TABS: 5.0000 mg | ORAL_TABLET | Freq: Once | ORAL | Status: DC

## 2017-06-08 MED ORDER — TETRACAINE HCL 1 % IJ SOLN
INTRAMUSCULAR | Status: AC
  Filled 2017-06-08: qty 2

## 2017-06-08 MED ORDER — PHENOL 1.4 % MT LIQD
1.0000 | OROMUCOSAL | Status: DC | PRN
  Filled 2017-06-08: qty 177

## 2017-06-08 MED ORDER — TRANEXAMIC ACID 1000 MG/10ML IV SOLN
INTRAVENOUS | Status: DC | PRN
  Administered 2017-06-08: 1000 mg via INTRAVENOUS

## 2017-06-08 MED ORDER — METOCLOPRAMIDE HCL 10 MG PO TABS
10.0000 mg | ORAL_TABLET | Freq: Three times a day (TID) | ORAL | Status: DC
  Administered 2017-06-08 – 2017-06-10 (×7): 10 mg via ORAL
  Filled 2017-06-08 (×7): qty 1

## 2017-06-08 MED ORDER — SODIUM CHLORIDE 0.9 % IJ SOLN
INTRAMUSCULAR | Status: AC
Start: 2017-06-08 — End: ?
  Filled 2017-06-08: qty 50

## 2017-06-08 MED ORDER — PROPOFOL 500 MG/50ML IV EMUL
INTRAVENOUS | Status: DC | PRN
  Administered 2017-06-08: 25 ug/kg/min via INTRAVENOUS

## 2017-06-08 MED ORDER — TRANEXAMIC ACID 1000 MG/10ML IV SOLN
1000.0000 mg | Freq: Once | INTRAVENOUS | Status: AC
  Administered 2017-06-08: 1000 mg via INTRAVENOUS
  Filled 2017-06-08: qty 10

## 2017-06-08 MED ORDER — DEXAMETHASONE SODIUM PHOSPHATE 10 MG/ML IJ SOLN
8.0000 mg | Freq: Once | INTRAMUSCULAR | Status: AC
  Administered 2017-06-08: 8 mg via INTRAVENOUS

## 2017-06-08 MED ORDER — MORPHINE SULFATE (PF) 2 MG/ML IV SOLN: 2.0000 mg | INTRAVENOUS | Status: DC | PRN

## 2017-06-08 MED ORDER — GABAPENTIN 300 MG PO CAPS
300.0000 mg | ORAL_CAPSULE | Freq: Every day | ORAL | Status: DC
  Administered 2017-06-08 – 2017-06-09 (×2): 300 mg via ORAL
  Filled 2017-06-08 (×2): qty 1

## 2017-06-08 MED ORDER — OXYCODONE HCL 5 MG PO TABS: 10.0000 mg | ORAL_TABLET | ORAL | Status: DC | PRN

## 2017-06-08 MED ORDER — CELECOXIB 200 MG PO CAPS
ORAL_CAPSULE | ORAL | Status: AC
  Filled 2017-06-08: qty 2

## 2017-06-08 MED ORDER — SODIUM CHLORIDE 0.9 % IV SOLN
INTRAVENOUS | Status: DC
  Administered 2017-06-08 (×2): via INTRAVENOUS

## 2017-06-08 MED ORDER — MENTHOL 3 MG MT LOZG
1.0000 | LOZENGE | OROMUCOSAL | Status: DC | PRN
  Filled 2017-06-08: qty 9

## 2017-06-08 MED ORDER — ENOXAPARIN SODIUM 30 MG/0.3ML ~~LOC~~ SOLN
30.0000 mg | Freq: Two times a day (BID) | SUBCUTANEOUS | Status: DC
  Administered 2017-06-09 – 2017-06-10 (×3): 30 mg via SUBCUTANEOUS
  Filled 2017-06-08 (×3): qty 0.3

## 2017-06-08 MED ORDER — OXYCODONE HCL 5 MG PO TABS
5.0000 mg | ORAL_TABLET | ORAL | Status: DC | PRN
  Administered 2017-06-08 – 2017-06-10 (×5): 5 mg via ORAL
  Filled 2017-06-08 (×5): qty 1

## 2017-06-08 MED ORDER — PROPOFOL 500 MG/50ML IV EMUL
INTRAVENOUS | Status: AC
  Filled 2017-06-08: qty 50

## 2017-06-08 MED ORDER — BUPIVACAINE HCL (PF) 0.5 % IJ SOLN
INTRAMUSCULAR | Status: DC | PRN
  Administered 2017-06-08: 2.4 mL via INTRATHECAL

## 2017-06-08 MED ORDER — GLYCOPYRROLATE 0.2 MG/ML IJ SOLN
INTRAMUSCULAR | Status: AC
  Filled 2017-06-08: qty 1

## 2017-06-08 MED ORDER — TRAMADOL HCL 50 MG PO TABS
50.0000 mg | ORAL_TABLET | ORAL | Status: DC | PRN
  Administered 2017-06-08: 50 mg via ORAL
  Administered 2017-06-09: 100 mg via ORAL
  Filled 2017-06-08: qty 2
  Filled 2017-06-08: qty 1

## 2017-06-08 MED ORDER — ACETAMINOPHEN 325 MG PO TABS: 650.0000 mg | ORAL_TABLET | ORAL | Status: DC | PRN

## 2017-06-08 MED ORDER — BISACODYL 10 MG RE SUPP
10.0000 mg | Freq: Every day | RECTAL | Status: DC | PRN
Start: 2017-06-08 — End: 2017-06-10

## 2017-06-08 MED ORDER — FAMOTIDINE 20 MG PO TABS
ORAL_TABLET | ORAL | Status: AC
  Filled 2017-06-08: qty 1

## 2017-06-08 MED ORDER — FAMOTIDINE 20 MG PO TABS
20.0000 mg | ORAL_TABLET | Freq: Once | ORAL | Status: AC
  Administered 2017-06-08: 20 mg via ORAL

## 2017-06-08 MED ORDER — ONDANSETRON HCL 4 MG/2ML IJ SOLN
4.0000 mg | Freq: Four times a day (QID) | INTRAMUSCULAR | Status: DC | PRN
Start: 2017-06-08 — End: 2017-06-10

## 2017-06-08 MED ORDER — SENNOSIDES-DOCUSATE SODIUM 8.6-50 MG PO TABS
1.0000 | ORAL_TABLET | Freq: Two times a day (BID) | ORAL | Status: DC
  Administered 2017-06-08 – 2017-06-10 (×5): 1 via ORAL
  Filled 2017-06-08 (×5): qty 1

## 2017-06-08 MED ORDER — ACETAMINOPHEN 10 MG/ML IV SOLN
INTRAVENOUS | Status: AC
  Filled 2017-06-08: qty 100

## 2017-06-08 MED ORDER — FENTANYL CITRATE (PF) 100 MCG/2ML IJ SOLN
INTRAMUSCULAR | Status: DC | PRN
  Administered 2017-06-08 (×2): 50 ug via INTRAVENOUS

## 2017-06-08 MED ORDER — ACETAMINOPHEN 10 MG/ML IV SOLN
1000.0000 mg | Freq: Four times a day (QID) | INTRAVENOUS | Status: AC
  Administered 2017-06-08 – 2017-06-09 (×3): 1000 mg via INTRAVENOUS
  Filled 2017-06-08 (×4): qty 100

## 2017-06-08 MED ORDER — GLYCOPYRROLATE 0.2 MG/ML IJ SOLN
INTRAMUSCULAR | Status: DC | PRN
  Administered 2017-06-08: 0.2 mg via INTRAVENOUS

## 2017-06-08 MED ORDER — BUPIVACAINE HCL (PF) 0.5 % IJ SOLN
INTRAMUSCULAR | Status: AC
  Filled 2017-06-08: qty 10

## 2017-06-08 MED ORDER — BUPIVACAINE LIPOSOME 1.3 % IJ SUSP
INTRAMUSCULAR | Status: AC
  Filled 2017-06-08: qty 20

## 2017-06-08 MED ORDER — MIDAZOLAM HCL 5 MG/5ML IJ SOLN
INTRAMUSCULAR | Status: DC | PRN
  Administered 2017-06-08 (×2): 1 mg via INTRAVENOUS

## 2017-06-08 MED ORDER — MIDAZOLAM HCL 2 MG/2ML IJ SOLN
INTRAMUSCULAR | Status: AC
  Filled 2017-06-08: qty 2

## 2017-06-08 MED ORDER — CEFAZOLIN SODIUM-DEXTROSE 2-4 GM/100ML-% IV SOLN
2.0000 g | Freq: Four times a day (QID) | INTRAVENOUS | Status: AC
  Administered 2017-06-08 – 2017-06-09 (×4): 2 g via INTRAVENOUS
  Filled 2017-06-08 (×4): qty 100

## 2017-06-08 MED ORDER — GABAPENTIN 300 MG PO CAPS
300.0000 mg | ORAL_CAPSULE | Freq: Once | ORAL | Status: AC
  Administered 2017-06-08: 300 mg via ORAL

## 2017-06-08 MED ORDER — OXYCODONE HCL 5 MG PO TABS
ORAL_TABLET | ORAL | Status: AC
  Filled 2017-06-08: qty 1

## 2017-06-08 MED ORDER — FLEET ENEMA 7-19 GM/118ML RE ENEM: 1.0000 | ENEMA | Freq: Once | RECTAL | Status: DC | PRN

## 2017-06-08 MED ORDER — NEOMYCIN-POLYMYXIN B GU 40-200000 IR SOLN
Status: AC
  Filled 2017-06-08: qty 20

## 2017-06-08 MED ORDER — BUPIVACAINE HCL (PF) 0.25 % IJ SOLN
INTRAMUSCULAR | Status: AC
  Filled 2017-06-08: qty 60

## 2017-06-08 MED ORDER — CELECOXIB 200 MG PO CAPS
200.0000 mg | ORAL_CAPSULE | Freq: Two times a day (BID) | ORAL | Status: DC
  Administered 2017-06-08 – 2017-06-10 (×4): 200 mg via ORAL
  Filled 2017-06-08 (×5): qty 1

## 2017-06-08 MED ORDER — ALUM & MAG HYDROXIDE-SIMETH 200-200-20 MG/5ML PO SUSP: 30.0000 mL | ORAL | Status: DC | PRN

## 2017-06-08 MED ORDER — TETRACAINE HCL 1 % IJ SOLN
INTRAMUSCULAR | Status: DC | PRN
  Administered 2017-06-08: 6 mg via INTRASPINAL

## 2017-06-08 MED ORDER — ACETAMINOPHEN 10 MG/ML IV SOLN
INTRAVENOUS | Status: DC | PRN
  Administered 2017-06-08: 1000 mg via INTRAVENOUS

## 2017-06-08 MED ORDER — ONDANSETRON HCL 4 MG/2ML IJ SOLN: 4.0000 mg | Freq: Once | INTRAMUSCULAR | Status: DC | PRN

## 2017-06-08 MED ORDER — CEFAZOLIN SODIUM-DEXTROSE 2-3 GM-%(50ML) IV SOLR
INTRAVENOUS | Status: DC | PRN
Start: 2017-06-08 — End: 2017-06-08
  Administered 2017-06-08: 2 g via INTRAVENOUS

## 2017-06-08 MED ORDER — DIPHENHYDRAMINE HCL 12.5 MG/5ML PO ELIX: 12.5000 mg | ORAL_SOLUTION | ORAL | Status: DC | PRN

## 2017-06-08 MED ORDER — BUPIVACAINE HCL (PF) 0.25 % IJ SOLN
INTRAMUSCULAR | Status: DC | PRN
  Administered 2017-06-08: 60 mL

## 2017-06-08 MED ORDER — GABAPENTIN 300 MG PO CAPS
ORAL_CAPSULE | ORAL | Status: AC
  Filled 2017-06-08: qty 1

## 2017-06-08 MED ORDER — LACTATED RINGERS IV SOLN
INTRAVENOUS | Status: DC
  Administered 2017-06-08 (×2): via INTRAVENOUS

## 2017-06-08 MED ORDER — FENTANYL CITRATE (PF) 100 MCG/2ML IJ SOLN: 25.0000 ug | INTRAMUSCULAR | Status: DC | PRN

## 2017-06-08 MED ORDER — FERROUS SULFATE 325 (65 FE) MG PO TABS
325.0000 mg | ORAL_TABLET | Freq: Two times a day (BID) | ORAL | Status: DC
  Administered 2017-06-08 – 2017-06-10 (×4): 325 mg via ORAL
  Filled 2017-06-08 (×4): qty 1

## 2017-06-08 MED ORDER — PANTOPRAZOLE SODIUM 40 MG PO TBEC
40.0000 mg | DELAYED_RELEASE_TABLET | Freq: Two times a day (BID) | ORAL | Status: DC
  Administered 2017-06-08 – 2017-06-10 (×5): 40 mg via ORAL
  Filled 2017-06-08 (×5): qty 1

## 2017-06-08 MED ORDER — MAGNESIUM HYDROXIDE 400 MG/5ML PO SUSP
30.0000 mL | Freq: Every day | ORAL | Status: DC | PRN
  Administered 2017-06-09: 30 mL via ORAL
  Filled 2017-06-08 (×2): qty 30

## 2017-06-08 MED ORDER — ACETAMINOPHEN 650 MG RE SUPP: 650.0000 mg | RECTAL | Status: DC | PRN

## 2017-06-08 MED ORDER — ONDANSETRON HCL 4 MG PO TABS: 4.0000 mg | ORAL_TABLET | Freq: Four times a day (QID) | ORAL | Status: DC | PRN

## 2017-06-08 MED ORDER — CELECOXIB 200 MG PO CAPS
400.0000 mg | ORAL_CAPSULE | Freq: Once | ORAL | Status: AC
  Administered 2017-06-08: 400 mg via ORAL

## 2017-06-08 SURGICAL SUPPLY — 66 items
BATTERY INSTRU NAVIGATION (MISCELLANEOUS) ×12 IMPLANT
BLADE SAW 1 (BLADE) ×3 IMPLANT
BLADE SAW 1/2 (BLADE) ×3 IMPLANT
BLADE SAW 70X12.5 (BLADE) IMPLANT
CANISTER SUCT 1200ML W/VALVE (MISCELLANEOUS) ×3 IMPLANT
CANISTER SUCT 3000ML PPV (MISCELLANEOUS) ×6 IMPLANT
CAPT KNEE TOTAL 3 ATTUNE ×3 IMPLANT
CEMENT HV SMART SET (Cement) ×6 IMPLANT
COOLER POLAR GLACIER W/PUMP (MISCELLANEOUS) ×3 IMPLANT
CUFF TOURN 24 STER (MISCELLANEOUS) IMPLANT
CUFF TOURN 30 STER DUAL PORT (MISCELLANEOUS) ×3 IMPLANT
DECANTER SPIKE VIAL GLASS SM (MISCELLANEOUS) ×3 IMPLANT
DRAPE SHEET LG 3/4 BI-LAMINATE (DRAPES) ×3 IMPLANT
DRSG DERMACEA 8X12 NADH (GAUZE/BANDAGES/DRESSINGS) ×3 IMPLANT
DRSG OPSITE POSTOP 4X14 (GAUZE/BANDAGES/DRESSINGS) ×3 IMPLANT
DRSG TEGADERM 4X4.75 (GAUZE/BANDAGES/DRESSINGS) ×3 IMPLANT
DURAPREP 26ML APPLICATOR (WOUND CARE) ×6 IMPLANT
ELECT CAUTERY BLADE 6.4 (BLADE) ×3 IMPLANT
ELECT REM PT RETURN 9FT ADLT (ELECTROSURGICAL) ×3
ELECTRODE REM PT RTRN 9FT ADLT (ELECTROSURGICAL) ×1 IMPLANT
EVACUATOR 1/8 PVC DRAIN (DRAIN) ×3 IMPLANT
EX-PIN ORTHOLOCK NAV 4X150 (PIN) ×6 IMPLANT
GLOVE BIOGEL M STRL SZ7.5 (GLOVE) ×9 IMPLANT
GLOVE BIOGEL PI IND STRL 7.5 (GLOVE) ×4 IMPLANT
GLOVE BIOGEL PI IND STRL 9 (GLOVE) ×1 IMPLANT
GLOVE BIOGEL PI INDICATOR 7.5 (GLOVE) ×8
GLOVE BIOGEL PI INDICATOR 9 (GLOVE) ×2
GLOVE INDICATOR 8.0 STRL GRN (GLOVE) ×3 IMPLANT
GLOVE SURG SYN 9.0  PF PI (GLOVE) ×2
GLOVE SURG SYN 9.0 PF PI (GLOVE) ×1 IMPLANT
GOWN STRL REUS W/ TWL LRG LVL3 (GOWN DISPOSABLE) ×3 IMPLANT
GOWN STRL REUS W/TWL 2XL LVL3 (GOWN DISPOSABLE) ×3 IMPLANT
GOWN STRL REUS W/TWL LRG LVL3 (GOWN DISPOSABLE) ×6
HOLDER FOLEY CATH W/STRAP (MISCELLANEOUS) ×3 IMPLANT
HOOD PEEL AWAY FLYTE STAYCOOL (MISCELLANEOUS) ×6 IMPLANT
KIT RM TURNOVER STRD PROC AR (KITS) ×3 IMPLANT
KNIFE SCULPS 14X20 (INSTRUMENTS) ×3 IMPLANT
LABEL OR SOLS (LABEL) ×3 IMPLANT
NDL SAFETY ECLIPSE 18X1.5 (NEEDLE) ×1 IMPLANT
NEEDLE HYPO 18GX1.5 SHARP (NEEDLE) ×2
NEEDLE SPNL 20GX3.5 QUINCKE YW (NEEDLE) ×6 IMPLANT
NS IRRIG 500ML POUR BTL (IV SOLUTION) ×3 IMPLANT
PACK TOTAL KNEE (MISCELLANEOUS) ×3 IMPLANT
PAD WRAPON POLAR KNEE (MISCELLANEOUS) ×1 IMPLANT
PIN DRILL QUICK PACK ×3 IMPLANT
PIN FIXATION 1/8DIA X 3INL (PIN) ×3 IMPLANT
PULSAVAC PLUS IRRIG FAN TIP (DISPOSABLE) ×3
SOL .9 NS 3000ML IRR  AL (IV SOLUTION) ×2
SOL .9 NS 3000ML IRR UROMATIC (IV SOLUTION) ×1 IMPLANT
SOL PREP PVP 2OZ (MISCELLANEOUS) ×3
SOLUTION PREP PVP 2OZ (MISCELLANEOUS) ×1 IMPLANT
SPONGE DRAIN TRACH 4X4 STRL 2S (GAUZE/BANDAGES/DRESSINGS) ×3 IMPLANT
STAPLER SKIN PROX 35W (STAPLE) ×3 IMPLANT
STRAP TIBIA SHORT (MISCELLANEOUS) ×3 IMPLANT
SUCTION FRAZIER HANDLE 10FR (MISCELLANEOUS) ×2
SUCTION TUBE FRAZIER 10FR DISP (MISCELLANEOUS) ×1 IMPLANT
SUT VIC AB 0 CT1 36 (SUTURE) ×3 IMPLANT
SUT VIC AB 1 CT1 36 (SUTURE) ×6 IMPLANT
SUT VIC AB 2-0 CT2 27 (SUTURE) ×3 IMPLANT
SYR 20CC LL (SYRINGE) ×3 IMPLANT
SYR 30ML LL (SYRINGE) ×6 IMPLANT
TIP FAN IRRIG PULSAVAC PLUS (DISPOSABLE) ×1 IMPLANT
TOWEL OR 17X26 4PK STRL BLUE (TOWEL DISPOSABLE) ×3 IMPLANT
TOWER CARTRIDGE SMART MIX (DISPOSABLE) ×3 IMPLANT
TRAY FOLEY W/METER SILVER 16FR (SET/KITS/TRAYS/PACK) ×3 IMPLANT
WRAPON POLAR PAD KNEE (MISCELLANEOUS) ×3

## 2017-06-08 NOTE — Op Note (Signed)
OPERATIVE NOTE  DATE OF SURGERY:  06/08/2017  PATIENT NAME:  Tanya Vazquez   DOB: 05-16-1946  MRN: [id number]  PRE-OPERATIVE DIAGNOSIS: Degenerative arthrosis of the right knee, primary  POST-OPERATIVE DIAGNOSIS:  Same  PROCEDURE:  Right total knee arthroplasty using computer-assisted navigation  SURGEON:  Jena GaussJames P Hooten, Jr. M.D.  ASSISTANT:  Van ClinesJon Wolfe, PA (present and scrubbed throughout the case, critical for assistance with exposure, retraction, instrumentation, and closure)  ANESTHESIA: spinal  ESTIMATED BLOOD LOSS: 150 mL  FLUIDS REPLACED: 1300 mL of crystalloid  TOURNIQUET TIME: 84 minutes  DRAINS: 2 medium Hemovac drains  SOFT TISSUE RELEASES: Anterior cruciate ligament, posterior cruciate ligament, deep medial collateral ligament, patellofemoral ligament  IMPLANTS UTILIZED: DePuy Attune size 5 posterior stabilized femoral component (cemented), size 5 rotating platform tibial component (cemented), 38 mm medialized dome patella (cemented), and a 10 mm stabilized rotating platform polyethylene insert.  INDICATIONS FOR SURGERY: Tanya Vazquez is a 72 y.o. year old female with a long history of progressive knee pain. X-rays demonstrated severe degenerative changes in tricompartmental fashion. The patient had not seen any significant improvement despite conservative nonsurgical intervention. After discussion of the risks and benefits of surgical intervention, the patient expressed understanding of the risks benefits and agree with plans for total knee arthroplasty.   The risks, benefits, and alternatives were discussed at length including but not limited to the risks of infection, bleeding, nerve injury, stiffness, blood clots, the need for revision surgery, cardiopulmonary complications, among others, and they were willing to proceed.  PROCEDURE IN DETAIL: The patient was brought into the operating room and, after adequate spinal anesthesia was achieved, a tourniquet was placed on  the patient's upper thigh. The patient's knee and leg were cleaned and prepped with alcohol and DuraPrep and draped in the usual sterile fashion. A "timeout" was performed as per usual protocol. The lower extremity was exsanguinated using an Esmarch, and the tourniquet was inflated to 300 mmHg. An anterior longitudinal incision was made followed by a standard mid vastus approach. The deep fibers of the medial collateral ligament were elevated in a subperiosteal fashion off of the medial flare of the tibia so as to maintain a continuous soft tissue sleeve. The patella was subluxed laterally and the patellofemoral ligament was incised. Inspection of the knee demonstrated severe degenerative changes with full-thickness loss of articular cartilage. Osteophytes were debrided using a rongeur. Anterior and posterior cruciate ligaments were excised. Two 4.0 mm Schanz pins were inserted in the femur and into the tibia for attachment of the array of trackers used for computer-assisted navigation. Hip center was identified using a circumduction technique. Distal landmarks were mapped using the computer. The distal femur and proximal tibia were mapped using the computer. The distal femoral cutting guide was positioned using computer-assisted navigation so as to achieve a 5 distal valgus cut. The femur was sized and it was felt that a size 5 femoral component was appropriate. A size 5 femoral cutting guide was positioned and the anterior cut was performed and verified using the computer. This was followed by completion of the posterior and chamfer cuts. Femoral cutting guide for the central box was then positioned in the center box cut was performed.  Attention was then directed to the proximal tibia. Medial and lateral menisci were excised. The extramedullary tibial cutting guide was positioned using computer-assisted navigation so as to achieve a 0 varus-valgus alignment and 3 posterior slope. The cut was performed and  verified using the computer. The proximal tibia was sized  and it was felt that a size 5 tibial tray was appropriate. Tibial and femoral trials were inserted followed by insertion of a 10 mm polyethylene insert. This allowed for excellent mediolateral soft tissue balancing both in flexion and in full extension. Finally, the patella was cut and prepared so as to accommodate a 38 mm medialized dome patella. A patella trial was placed and the knee was placed through a range of motion with excellent patellar tracking appreciated. The femoral trial was removed after debridement of posterior osteophytes. The central post-hole for the tibial component was reamed followed by insertion of a keel punch. Tibial trials were then removed. Cut surfaces of bone were irrigated with copious amounts of normal saline with antibiotic solution using pulsatile lavage and then suctioned dry. Polymethylmethacrylate cement was prepared in the usual fashion using a vacuum mixer. Cement was applied to the cut surface of the proximal tibia as well as along the undersurface of a size 5 rotating platform tibial component. Tibial component was positioned and impacted into place. Excess cement was removed using Personal assistant. Cement was then applied to the cut surfaces of the femur as well as along the posterior flanges of the size 5 femoral component. The femoral component was positioned and impacted into place. Excess cement was removed using Personal assistant. A 10 mm polyethylene trial was inserted and the knee was brought into full extension with steady axial compression applied. Finally, cement was applied to the backside of a 38 mm medialized dome patella and the patellar component was positioned and patellar clamp applied. Excess cement was removed using Personal assistant. After adequate curing of the cement, the tourniquet was deflated after a total tourniquet time of 84 minutes. Hemostasis was achieved using electrocautery. The knee was  irrigated with copious amounts of normal saline with antibiotic solution using pulsatile lavage and then suctioned dry. 20 mL of 1.3% Exparel and 60 mL of 0.25% Marcaine in 40 mL of normal saline was injected along the posterior capsule, medial and lateral gutters, and along the arthrotomy site. A 10 mm stabilized rotating platform polyethylene insert was inserted and the knee was placed through a range of motion with excellent mediolateral soft tissue balancing appreciated and excellent patellar tracking noted. 2 medium drains were placed in the wound bed and brought out through separate stab incisions. The medial parapatellar portion of the incision was reapproximated using interrupted sutures of #1 Vicryl. Subcutaneous tissue was approximated in layers using first #0 Vicryl followed #2-0 Vicryl. The skin was approximated with skin staples. A sterile dressing was applied.  The patient tolerated the procedure well and was transported to the recovery room in stable condition.    James P. Angie Fava., M.D.

## 2017-06-08 NOTE — Transfer of Care (Signed)
Immediate Anesthesia Transfer of Care Note  Patient: Tanya Vazquez  Procedure(s) Performed: COMPUTER ASSISTED TOTAL KNEE ARTHROPLASTY (Right Knee)  Patient Location: PACU  Anesthesia Type:Spinal  Level of Consciousness: awake, alert  and oriented  Airway & Oxygen Therapy: Patient Spontanous Breathing  Post-op Assessment: Report given to RN and Post -op Vital signs reviewed and stable  Post vital signs: Reviewed  Last Vitals:  Vitals:   06/08/17 0945 06/08/17 1330  BP: (!) 189/89 (!) 160/89  Pulse: 73 84  Resp: 16 17  Temp: 36.5 C 36.9 C  SpO2: 100% 97%    Last Pain:  Vitals:   06/08/17 0945  TempSrc: Tympanic         Complications: No apparent anesthesia complications

## 2017-06-08 NOTE — [place] FL2 (Signed)
[place] MEDICAID FL2 LEVEL OF CARE SCREENING TOOL     IDENTIFICATION  Patient Name: Tanya Vazquez Birthdate: 1945/07/31 Sex: female Admission Date (Current Location): 06/08/2017  [place] and [place] Number:  Chiropodist and Address:  [facility], [address], [place], [place] [address]      Provider Number: [id number]  Attending Physician Name and Address:  Donato Heinz, MD  Relative Name and Phone Number:       Current Level of Care: Hospital Recommended Level of Care: Skilled Nursing Facility Prior Approval Number:    Date Approved/Denied:   PASRR Number: ([id number] )  Discharge Plan: SNF    Current Diagnoses: Patient Active Problem List   Diagnosis Date Noted  . S/P total knee arthroplasty 06/08/2017  . Status post total left knee replacement 02/04/2017    Orientation RESPIRATION BLADDER Height & Weight     Self, Time, Situation, Place  Normal Continent Weight: 160 lb (72.6 kg) Height:  5\' 7"  (170.2 cm)  BEHAVIORAL SYMPTOMS/MOOD NEUROLOGICAL BOWEL NUTRITION STATUS      Continent Diet(Diet: Clear Liquid to be Advanced. )  AMBULATORY STATUS COMMUNICATION OF NEEDS Skin   Extensive Assist Verbally Surgical wounds(Incision: Right Knee. )                       Personal Care Assistance Level of Assistance  Bathing, Feeding, Dressing Bathing Assistance: Limited assistance Feeding assistance: Independent Dressing Assistance: Limited assistance     Functional Limitations Info  Sight, Hearing, Speech Sight Info: Adequate Hearing Info: Adequate Speech Info: Adequate    SPECIAL CARE FACTORS FREQUENCY  PT (By licensed PT), OT (By licensed OT)     PT Frequency: (5) OT Frequency: (5)            Contractures      Additional Factors Info  Code Status, Allergies Code Status Info: (Full Code. ) Allergies Info: (No Known Allergies. )           Current Medications (06/08/2017):  This is the current hospital  active medication list Current Facility-Administered Medications  Medication Dose Route Frequency Provider Last Rate Last Dose  . 0.9 %  sodium chloride infusion   Intravenous Continuous Hooten, Illene Labrador, MD 100 mL/hr at 06/08/17 1551    . acetaminophen (OFIRMEV) IV 1,000 mg  1,000 mg Intravenous Q6H Hooten, Illene Labrador, MD      . acetaminophen (TYLENOL) tablet 650 mg  650 mg Oral Q4H PRN Hooten, Illene Labrador, MD       Or  . acetaminophen (TYLENOL) suppository 650 mg  650 mg Rectal Q4H PRN Hooten, Illene Labrador, MD      . alum & mag hydroxide-simeth (MAALOX/MYLANTA) 200-200-20 MG/5ML suspension 30 mL  30 mL Oral Q4H PRN Hooten, Illene Labrador, MD      . bisacodyl (DULCOLAX) suppository 10 mg  10 mg Rectal Daily PRN Hooten, Illene Labrador, MD      . ceFAZolin (ANCEF) 2-4 GM/100ML-% IVPB           . ceFAZolin (ANCEF) IVPB 2g/100 mL premix  2 g Intravenous Q6H Hooten, Illene Labrador, MD      . celecoxib (CELEBREX) 200 MG capsule           . celecoxib (CELEBREX) capsule 200 mg  200 mg Oral Q12H Hooten, Illene Labrador, MD   200 mg at 06/08/17 1553  . dexamethasone (DECADRON) 10 MG/ML injection           .  diphenhydrAMINE (BENADRYL) 12.5 MG/5ML elixir 12.5-25 mg  12.5-25 mg Oral Q4H PRN Donato HeinzHooten, James P, MD      . Melene Muller[START ON 06/09/2017] enoxaparin (LOVENOX) injection 30 mg  30 mg Subcutaneous Q12H Hooten, Illene LabradorJames P, MD      . famotidine (PEPCID) 20 MG tablet           . ferrous sulfate tablet 325 mg  325 mg Oral BID WC Hooten, Illene LabradorJames P, MD   325 mg at 06/08/17 1552  . gabapentin (NEURONTIN) 300 MG capsule           . gabapentin (NEURONTIN) capsule 300 mg  300 mg Oral QHS Hooten, Illene LabradorJames P, MD      . magnesium hydroxide (MILK OF MAGNESIA) suspension 30 mL  30 mL Oral Daily PRN Hooten, Illene LabradorJames P, MD      . menthol-cetylpyridinium (CEPACOL) lozenge 3 mg  1 lozenge Oral PRN Hooten, Illene LabradorJames P, MD       Or  . phenol (CHLORASEPTIC) mouth spray 1 spray  1 spray Mouth/Throat PRN Hooten, Illene LabradorJames P, MD      . metoCLOPramide (REGLAN) tablet 10 mg  10 mg Oral TID AC &  HS Hooten, Illene LabradorJames P, MD   10 mg at 06/08/17 1553  . morphine 2 MG/ML injection 2 mg  2 mg Intravenous Q2H PRN Hooten, Illene LabradorJames P, MD      . ondansetron (ZOFRAN) tablet 4 mg  4 mg Oral Q6H PRN Hooten, Illene LabradorJames P, MD       Or  . ondansetron (ZOFRAN) injection 4 mg  4 mg Intravenous Q6H PRN Hooten, Illene LabradorJames P, MD      . oxyCODONE (Oxy IR/ROXICODONE) immediate release tablet 10 mg  10 mg Oral Q3H PRN Hooten, Illene LabradorJames P, MD      . oxyCODONE (Oxy IR/ROXICODONE) immediate release tablet 5 mg  5 mg Oral Q3H PRN Hooten, Illene LabradorJames P, MD   5 mg at 06/08/17 1552  . pantoprazole (PROTONIX) EC tablet 40 mg  40 mg Oral BID Donato HeinzHooten, James P, MD   40 mg at 06/08/17 1552  . senna-docusate (Senokot-S) tablet 1 tablet  1 tablet Oral BID Hooten, Illene LabradorJames P, MD   1 tablet at 06/08/17 1554  . sodium phosphate (FLEET) 7-19 GM/118ML enema 1 enema  1 enema Rectal Once PRN Hooten, Illene LabradorJames P, MD      . traMADol Janean Sark(ULTRAM) tablet 50-100 mg  50-100 mg Oral Q4H PRN Hooten, Illene LabradorJames P, MD         Discharge Medications: Please see discharge summary for a list of discharge medications.  Relevant Imaging Results:  Relevant Lab Results:   Additional Information (SSN: [id number])  Manny Vitolo, Darleen CrockerBailey M, LCSW

## 2017-06-08 NOTE — Anesthesia Preprocedure Evaluation (Signed)
Anesthesia Evaluation  Patient identified by MRN, date of birth, ID band Patient awake    Reviewed: Allergy & Precautions, NPO status , Patient's Chart, lab work & pertinent test results  History of Anesthesia Complications (+) PROLONGED EMERGENCE and history of anesthetic complications  Airway Mallampati: II       Dental   Pulmonary neg sleep apnea, neg COPD,           Cardiovascular (-) hypertension(-) Past MI and (-) CHF (-) dysrhythmias (-) Valvular Problems/Murmurs     Neuro/Psych neg Seizures    GI/Hepatic Neg liver ROS, GERD  ,  Endo/Other  neg diabetes  Renal/GU negative Renal ROS     Musculoskeletal   Abdominal   Peds  Hematology   Anesthesia Other Findings   Reproductive/Obstetrics                             Anesthesia Physical Anesthesia Plan  ASA: II  Anesthesia Plan: Spinal   Post-op Pain Management:    Induction:   PONV Risk Score and Plan:   Airway Management Planned: Nasal Cannula and Simple Face Mask  Additional Equipment:   Intra-op Plan:   Post-operative Plan:   Informed Consent: I have reviewed the patients History and Physical, chart, labs and discussed the procedure including the risks, benefits and alternatives for the proposed anesthesia with the patient or authorized representative who has indicated his/her understanding and acceptance.     Plan Discussed with:   Anesthesia Plan Comments:         Anesthesia Quick Evaluation

## 2017-06-08 NOTE — H&P (Signed)
The patient has been re-examined, and the chart reviewed, and there have been no interval changes to the documented history and physical.    The risks, benefits, and alternatives have been discussed at length. The patient expressed understanding of the risks benefits and agreed with plans for surgical intervention.  Arlene Genova P. Nirvaan Frett, Jr. M.D.    

## 2017-06-08 NOTE — Anesthesia Procedure Notes (Signed)
Spinal  Patient location during procedure: OR Staffing Anesthesiologist: Gunnar Fusi, MD Resident/CRNA: Rolla Plate, CRNA Performed: resident/CRNA  Preanesthetic Checklist Completed: patient identified, site marked, surgical consent, pre-op evaluation, timeout performed, IV checked, risks and benefits discussed and monitors and equipment checked Spinal Block Patient position: sitting Prep: ChloraPrep and site prepped and draped Patient monitoring: heart rate, continuous pulse ox, blood pressure and cardiac monitor Approach: midline Location: L4-5 Injection technique: single-shot Needle Needle type: Whitacre and Introducer  Needle gauge: 24 G Needle length: 9 cm Additional Notes Negative paresthesia. Negative blood return. Positive free-flowing CSF. Expiration date of kit checked and confirmed. Patient tolerated procedure well, without complications.

## 2017-06-08 NOTE — Anesthesia Post-op Follow-up Note (Signed)
Anesthesia QCDR form completed.        

## 2017-06-09 MED ORDER — ENOXAPARIN SODIUM 40 MG/0.4ML ~~LOC~~ SOLN: 40.0000 mg | SUBCUTANEOUS | 0 refills | Status: AC

## 2017-06-09 MED ORDER — TRAMADOL HCL 50 MG PO TABS: 50.0000 mg | ORAL_TABLET | ORAL | 0 refills | Status: AC | PRN

## 2017-06-09 MED ORDER — OXYCODONE HCL 5 MG PO TABS: 5.0000 mg | ORAL_TABLET | ORAL | 0 refills | Status: AC | PRN

## 2017-06-09 NOTE — Care Management Note (Addendum)
Case Management Note  Patient Details  Name: Tanya Vazquez MRN: [id number] Date of Birth: Jan 16, 1946  Subjective/Objective:  POD # 1 s/p right TKA. Met with patient , spouse and her dgt at bedside. Patient lives at home with spouse who will be her caregiver. Offered choice of home health agencies. Referral to [facility] for HHPT. She has a walker at home. Will call regarding Lovenox price prior to discharge. Pharmacy: [facility] [phone]                Action/Plan: [facility] for HHPT, Has DME. Check Lovenox price prior to DC  Expected Discharge Date:  06/10/17               Expected Discharge Plan:  [place]  In-House Referral:     Discharge planning Services  CM Consult  Post Acute Care Choice:  Home Health Choice offered to:  Patient  DME Arranged:    DME Agency:     HH Arranged:  PT [place]:  [facility] (formerly [facility])  Status of Service:  Completed, signed off  If discussed at [employer], dates discussed:    Additional Comments:  Jolly Mango, RN 06/09/2017, 11:24 AM

## 2017-06-09 NOTE — Anesthesia Postprocedure Evaluation (Signed)
Anesthesia Post Note  Patient: Tanya FountainJudy Vazquez  Procedure(s) Performed: COMPUTER ASSISTED TOTAL KNEE ARTHROPLASTY (Right Knee)  Patient location during evaluation: Nursing Unit Anesthesia Type: Spinal Level of consciousness: awake Pain management: pain level controlled Vital Signs Assessment: post-procedure vital signs reviewed and stable Respiratory status: spontaneous breathing Cardiovascular status: blood pressure returned to baseline Postop Assessment: no headache Anesthetic complications: no     Last Vitals:  Vitals:   06/09/17 0400 06/09/17 0734  BP: 124/63 (!) 144/73  Pulse: 64 (!) 58  Resp: 16 16  Temp: 36.7 C 36.4 C  SpO2: 96% 100%    Last Pain:  Vitals:   06/09/17 0902  TempSrc:   PainSc: 1                  Vernie MurdersPope,  Irianna Gilday G

## 2017-06-09 NOTE — Progress Notes (Signed)
Clinical Social Worker (CSW) received SNF consult. PT is recommending home health. RN case manager aware of above. Please reconsult if future social work needs arise. CSW signing off.   Kylia Grajales, LCSW [phone] 

## 2017-06-09 NOTE — Discharge Summary (Signed)
Physician Discharge Summary  Patient ID: Tanya FountainJudy Height MRN: [id number] DOB/AGE: [birth year] 72 y.o.  Admit date: 06/08/2017 Discharge date: 06/10/2017  Admission Diagnoses:  PRIMARY OSTEOARTHRITIS OF RIGHT KNEE   Discharge Diagnoses: Patient Active Problem List   Diagnosis Date Noted  . S/P total knee arthroplasty 06/08/2017  . Status post total left knee replacement 02/04/2017    Past Medical History:  Diagnosis Date  . Arthritis   . Complication of anesthesia    slow to wake up after anesthesia  . GERD (gastroesophageal reflux disease)      Transfusion: No transfusions during this admission   Consultants (if any):   Discharged Condition: Improved  Hospital Course: Tanya Vazquez is an 72 y.o. female who was admitted 06/08/2017 with a diagnosis of degenerative arthrosis right knee and went to the operating room on 06/08/2017 and underwent the above named procedures.    Surgeries:Procedure(s): COMPUTER ASSISTED TOTAL KNEE ARTHROPLASTY on 06/08/2017  PRE-OPERATIVE DIAGNOSIS: Degenerative arthrosis of the right knee, primary  POST-OPERATIVE DIAGNOSIS:  Same  PROCEDURE:  Right total knee arthroplasty using computer-assisted navigation  SURGEON:  Jena GaussJames P Hooten, Jr. M.D.  ASSISTANT:  Van ClinesJon Yared Barefoot, PA (present and scrubbed throughout the case, critical for assistance with exposure, retraction, instrumentation, and closure)  ANESTHESIA: spinal  ESTIMATED BLOOD LOSS: 150 mL  FLUIDS REPLACED: 1300 mL of crystalloid  TOURNIQUET TIME: 84 minutes  DRAINS: 2 medium Hemovac drains  SOFT TISSUE RELEASES: Anterior cruciate ligament, posterior cruciate ligament, deep medial collateral ligament, patellofemoral ligament  IMPLANTS UTILIZED: DePuy Attune size 5 posterior stabilized femoral component (cemented), size 5 rotating platform tibial component (cemented), 38 mm medialized dome patella (cemented), and a 10 mm stabilized rotating platform polyethylene insert.  INDICATIONS  FOR SURGERY: Tanya FountainJudy Fuhrer is a 72 y.o. year old female with a long history of progressive knee pain. X-rays demonstrated severe degenerative changes in tricompartmental fashion. The patient had not seen any significant improvement despite conservative nonsurgical intervention. After discussion of the risks and benefits of surgical intervention, the patient expressed understanding of the risks benefits and agree with plans for total knee arthroplasty.   The risks, benefits, and alternatives were discussed at length including but not limited to the risks of infection, bleeding, nerve injury, stiffness, blood clots, the need for revision surgery, cardiopulmonary complications, among others, and they were willing to proceed.   Patient tolerated the surgery well. No complications .Patient was taken to PACU where she was stabilized and then transferred to the orthopedic floor.  Patient started on Lovenox 30 mg q 12 hrs. Foot pumps applied bilaterally at 80 mm hgb. Heels elevated off bed with rolled towels. No evidence of DVT. Calves non tender. Negative Homan. Physical therapy started on day #1 for gait training and transfer with OT starting on  day #1 for ADL and assisted devices. Patient has done well with therapy. Ambulated greater than 200 feet upon being discharged. Was able to ascend and descend 4 steps safely and independently  Patient's IV And Foley were discontinued on day #1 with Hemovac being discontinued on day #2. Dressing was changed on day 2 prior to patient being discharged   She was given perioperative antibiotics:  Anti-infectives (From admission, onward)   Start     Dose/Rate Route Frequency Ordered Stop   06/08/17 1630  ceFAZolin (ANCEF) IVPB 2g/100 mL premix     2 g 200 mL/hr over 30 Minutes Intravenous Every 6 hours 06/08/17 1530 06/09/17 1629   06/08/17 0933  ceFAZolin (ANCEF) 2-4 GM/100ML-% IVPB  CommentsCarma Lair   : cabinet override      06/08/17 0933 06/08/17  2144   06/08/17 0600  ceFAZolin (ANCEF) IVPB 2g/100 mL premix  Status:  Discontinued     2 g 200 mL/hr over 30 Minutes Intravenous On call to O.R. 06/07/17 2154 06/08/17 0931    .  She was fitted with AV 1 compression foot pump devices, instructed on heel pumps, early ambulation, and fitted with TED stockings bilaterally for DVT prophylaxis.  She benefited maximally from the hospital stay and there were no complications.    Recent vital signs:  Vitals:   06/09/17 0400 06/09/17 0734  BP: 124/63 (!) 144/73  Pulse: 64 (!) 58  Resp: 16 16  Temp: 98 F (36.7 C) 97.6 F (36.4 C)  SpO2: 96% 100%    Recent laboratory studies:  Lab Results  Component Value Date   HGB 12.3 05/28/2017   HGB 10.3 (L) 02/06/2017   HGB 10.5 (L) 02/05/2017   Lab Results  Component Value Date   WBC 5.5 05/28/2017   PLT 233 05/28/2017   Lab Results  Component Value Date   INR 0.88 05/28/2017   Lab Results  Component Value Date   NA 138 05/28/2017   K 3.5 05/28/2017   CL 103 05/28/2017   CO2 28 05/28/2017   BUN 12 05/28/2017   CREATININE 0.66 05/28/2017   GLUCOSE 92 05/28/2017    Discharge Medications:   Allergies as of 06/09/2017   No Known Allergies     Medication List    TAKE these medications   acetaminophen 650 MG CR tablet Commonly known as:  TYLENOL Take 1,300 mg by mouth every 8 (eight) hours as needed for pain.   DIUREX PO Take 1 tablet by mouth as needed.   enoxaparin 40 MG/0.4ML injection Commonly known as:  LOVENOX Inject 0.4 mLs (40 mg total) into the skin daily for 14 days. Start taking on:  06/11/2017   OVER THE COUNTER MEDICATION Take 1 capsule by mouth daily. Hemp Oil Capsule   oxyCODONE 5 MG immediate release tablet Commonly known as:  Oxy IR/ROXICODONE Take 1 tablet (5 mg total) by mouth every 3 (three) hours as needed for moderate pain ((score 4 to 6)).   traMADol 50 MG tablet Commonly known as:  ULTRAM Take 1-2 tablets (50-100 mg total) by mouth every 4  (four) hours as needed for moderate pain.            Durable Medical Equipment  (From admission, onward)        Start     Ordered   06/08/17 1531  DME Walker rolling  Once    Question:  Patient needs a walker to treat with the following condition  Answer:  Total knee replacement status   06/08/17 1530   06/08/17 1531  DME Bedside commode  Once    Question:  Patient needs a bedside commode to treat with the following condition  Answer:  Total knee replacement status   06/08/17 1530      Diagnostic Studies: Dg Knee Right Port  Result Date: 06/08/2017 CLINICAL DATA:  Total knee arthroplasty EXAM: PORTABLE RIGHT KNEE - 1-2 VIEW COMPARISON:  None. FINDINGS: Total knee arthroplasty that is well seated. Negative for fracture or malalignment. Expected soft tissue gas. Suprapatellar drain. IMPRESSION: Total knee arthroplasty without complicating feature. Electronically Signed   By: Marnee Spring M.D.   On: 06/08/2017 13:56    Disposition: 06-Home-Health Care Svc  Discharge Instructions  Increase activity slowly   Complete by:  As directed       Follow-up Information    Tera Partridge, PA On 06/23/2017.   Specialty:  Physician Assistant Why:  at 1:15pm Contact information: [address] [facility] [place] [address] [phone]        Donato Heinz, MD On 07/21/2017.   Specialty:  Orthopedic Surgery Why:  at 1:45pm Contact information: [address] [facility] [place] [address] [phone]            Signed: Tera Partridge 06/09/2017, 7:35 AM

## 2017-06-09 NOTE — Progress Notes (Signed)
   Subjective: 1 Day Post-Op Procedure(s) (LRB): COMPUTER ASSISTED TOTAL KNEE ARTHROPLASTY (Right) Patient reports pain as mild.   Patient is well, and has had no acute complaints or problems We will start therapy today.  Plan is to go Home after hospital stay. no nausea and no vomiting Patient denies any chest pains or shortness of breath. Patient up sitting in a chair. Doing well. We'll see no complaints.  Objective: Vital signs in last 24 hours: Temp:  [97.7 F (36.5 C)-98.4 F (36.9 C)] 98 F (36.7 C) (01/08 0400) Pulse Rate:  [64-85] 64 (01/08 0400) Resp:  [13-17] 16 (01/08 0400) BP: (124-189)/(61-95) 124/63 (01/08 0400) SpO2:  [95 %-100 %] 96 % (01/08 0400) Weight:  [72.6 kg (160 lb)] 72.6 kg (160 lb) (01/07 0945) Heels are non tender and elevated off the bed using rolled towels and using the bone foam under operative leg while in bed.  Intake/Output from previous day: 01/07 0701 - 01/08 0700 In: 2075 [P.O.:120; I.V.:1855; IV Piggyback:100] Out: 2225 [Urine:1945; Drains:130; Blood:150] Intake/Output this shift: No intake/output data recorded.  No results for input(s): HGB in the last 72 hours. No results for input(s): WBC, RBC, HCT, PLT in the last 72 hours. No results for input(s): NA, K, CL, CO2, BUN, CREATININE, GLUCOSE, CALCIUM in the last 72 hours. No results for input(s): LABPT, INR in the last 72 hours.  EXAM General - Patient is Alert, Appropriate and Oriented Extremity - Neurologically intact Neurovascular intact Sensation intact distally Intact pulses distally Dorsiflexion/Plantar flexion intact Compartment soft Dressing - dressing C/D/I Motor Function - intact, moving foot and toes well on exam. Able to do straight leg raises on her own  Past Medical History:  Diagnosis Date  . Arthritis   . Complication of anesthesia    slow to wake up after anesthesia  . GERD (gastroesophageal reflux disease)     Assessment/Plan: 1 Day Post-Op Procedure(s)  (LRB): COMPUTER ASSISTED TOTAL KNEE ARTHROPLASTY (Right) Active Problems:   S/P total knee arthroplasty  Estimated body mass index is 25.06 kg/m as calculated from the following:   Height as of this encounter: 5\' 7"  (1.702 m).   Weight as of this encounter: 72.6 kg (160 lb). Advance diet Up with therapy D/C IV fluids Plan for discharge tomorrow Discharge home with home health  Labs: None back as of this dictation DVT Prophylaxis - Lovenox, Foot Pumps and TED hose Weight-Bearing as tolerated to right leg D/C O2 and Pulse OX and try on Room Air Begin working on bowel movement  Mariaceleste Herrera R. [facility] PA [facility] 06/09/2017, 7:30 AM

## 2017-06-09 NOTE — Progress Notes (Signed)
Physical Therapy Treatment Patient Details Name: Tanya Vazquez MRN: [id number] DOB: [birth year] Today's Date: 06/09/2017    History of Present Illness 72 y/o female s/p R total knee replacement 06/08/17.    PT Comments    Pt continues to exceed typical POD1 expectations with 2 loops around the nurses' station with no fatigue, no pain, consistent speed and cadence and no hesitation.  She showed ability to hold SLRs against resistance and did well with all exercises and continues to easily achieve >90 ROM in flexion.  Pt pleasant and motivated the entire time, doing very well and is eager to get home when she can.     Follow Up Recommendations  DC plan and follow up therapy as arranged by surgeon     Equipment Recommendations  None recommended by PT    Recommendations for Other Services       Precautions / Restrictions Precautions Precautions: Fall;Knee Restrictions RLE Weight Bearing: Weight bearing as tolerated    Mobility  Bed Mobility Overal bed mobility: Modified Independent             General bed mobility comments: Pt able to get to supine from sitting EOB w/o assist  Transfers Overall transfer level: Independent Equipment used: Rolling walker (2 wheeled)             General transfer comment: Pt able to rise w/o needing AD to maintain balance  Ambulation/Gait Ambulation/Gait assistance: Modified independent (Device/Increase time) Ambulation Distance (Feet): 350 Feet Assistive device: Rolling walker (2 wheeled)       General Gait Details: Pt again walks with great confidence, stride and no hesitation/limp.  She had very little fatigue, felt very confidence and generally did much better than typical POD1 expecations.    Stairs            Wheelchair Mobility    Modified Rankin (Stroke Patients Only)       Balance Overall balance assessment: Independent                                          Cognition Arousal/Alertness:  Awake/alert Behavior During Therapy: WFL for tasks assessed/performed Overall Cognitive Status: Within Functional Limits for tasks assessed                                        Exercises Total Joint Exercises Ankle Circles/Pumps: Strengthening;10 reps Quad Sets: Strengthening;15 reps Gluteal Sets: Strengthening;15 reps Heel Slides: Strengthening;10 reps Hip ABduction/ADduction: Strengthening;10 reps Straight Leg Raises: 15 reps;Strengthening Knee Flexion: PROM;5 reps    General Comments        Pertinent Vitals/Pain Pain Assessment: (max pain of 2/10 t/o session, "more sore than anything")    Home Living                      Prior Function            PT Goals (current goals can now be found in the care plan section) Progress towards PT goals: Progressing toward goals    Frequency    BID      PT Plan Current plan remains appropriate    Co-evaluation              AM-PAC PT "6 Clicks" Daily Activity  Outcome Measure  Difficulty turning over  in bed (including adjusting bedclothes, sheets and blankets)?: None Difficulty moving from lying on back to sitting on the side of the bed? : None Difficulty sitting down on and standing up from a chair with arms (e.g., wheelchair, bedside commode, etc,.)?: None Help needed moving to and from a bed to chair (including a wheelchair)?: None Help needed walking in hospital room?: None Help needed climbing 3-5 steps with a railing? : None 6 Click Score: 24    End of Session Equipment Utilized During Treatment: Gait belt Activity Tolerance: Patient tolerated treatment well Patient left: with call bell/phone within reach;with bed alarm set Nurse Communication: Mobility status PT Visit Diagnosis: Muscle weakness (generalized) (M62.81);Difficulty in walking, not elsewhere classified (R26.2)     Time: 1610-9604 PT Time Calculation (min) (ACUTE ONLY): 29 min  Charges:  $Gait Training: 8-22  mins $Therapeutic Exercise: 8-22 mins                    G Codes:       Malachi Pro, DPT 06/09/2017, 4:59 PM

## 2017-06-09 NOTE — Evaluation (Signed)
Physical Therapy Evaluation Patient Details Name: Tanya Vazquez MRN: [id number] DOB: March 01, 1946 Today's Date: 06/09/2017   History of Present Illness  72 y/o female s/p R total knee replacement 06/08/17.  Clinical Impression  Pt did exceptionally well with all aspects of POD1 PT.  She displayed exceptional strength (easily did SLRs, showed great quad control and tolerance to resisted activity (~10 minutes of exercises apart from PT exam), she walked smoothly and w/o hesitation and good speed and also was able to independently negotiate up/down steps.  Overall she is well past typical POD1 expectations and generally is doing great.      Follow Up Recommendations DC plan and follow up therapy as arranged by surgeon    Equipment Recommendations  None recommended by PT    Recommendations for Other Services       Precautions / Restrictions Precautions Precautions: Fall;Knee Restrictions Weight Bearing Restrictions: Yes RLE Weight Bearing: Weight bearing as tolerated      Mobility  Bed Mobility               General bed mobility comments: pt in recliner on arrival, not tested  Transfers Overall transfer level: Independent Equipment used: Rolling walker (2 wheeled)             General transfer comment: Pt able to rise to standing easily and w/o assist, minimal use of walker for balance  Ambulation/Gait Ambulation/Gait assistance: Modified independent (Device/Increase time) Ambulation Distance (Feet): 250 Feet Assistive device: Rolling walker (2 wheeled)       General Gait Details: Pt walks with great confidence and consistent speed showing no limp and minimal reliance on UEs.  She showed great effort, had minimal fatigue and no safety issues.  Pt did exceeding well for the first time doing prolonged ambulation  Stairs Stairs: Yes     Number of Stairs: 4 General stair comments: Pt able to easily negotiate up/down 4 steps w/o assist and needed only minimal cuing.     Wheelchair Mobility    Modified Rankin (Stroke Patients Only)       Balance Overall balance assessment: Independent                                           Pertinent Vitals/Pain Pain Assessment: No/denies pain(pt did report 2/10 minimal pain after exercises/walking)    Home Living Family/patient expects to be discharged to:: Private residence Living Arrangements: Spouse/significant other Available Help at Discharge: Family Type of Home: House Home Access: Stairs to enter Entrance Stairs-Rails: Right;Left;Can reach both Entrance Stairs-Number of Steps: 4 Home Layout: One level Home Equipment: Shower seat;Shower seat - built in      Prior Function Level of Independence: Independent         Comments: Pt able to be active, has been rehabbing L and prehabbing R since L TKA 4 months ago     Hand Dominance        Extremity/Trunk Assessment   Upper Extremity Assessment Upper Extremity Assessment: Overall WFL for tasks assessed    Lower Extremity Assessment Lower Extremity Assessment: Overall WFL for tasks assessed(great R LE strength and muscle control post-op)       Communication   Communication: No difficulties  Cognition Arousal/Alertness: Awake/alert Behavior During Therapy: WFL for tasks assessed/performed Overall Cognitive Status: Within Functional Limits for tasks assessed  General Comments      Exercises Total Joint Exercises Ankle Circles/Pumps: Strengthening;10 reps Quad Sets: Strengthening;10 reps Gluteal Sets: Strengthening;10 reps Heel Slides: Strengthening;10 reps Hip ABduction/ADduction: Strengthening;10 reps Straight Leg Raises: AROM;15 reps Long Arc Quad: Strengthening;10 reps Knee Flexion: PROM;5 reps Goniometric ROM: 0-101   Assessment/Plan    PT Assessment Patient needs continued PT services  PT Problem List Decreased strength;Decreased range of  motion;Decreased activity tolerance;Decreased balance;Decreased mobility;Decreased knowledge of use of DME;Decreased safety awareness;Pain       PT Treatment Interventions Gait training;Stair training;Functional mobility training;Therapeutic exercise;Balance training;Therapeutic activities;Neuromuscular re-education;Patient/family education    PT Goals (Current goals can be found in the Care Plan section)  Acute Rehab PT Goals Patient Stated Goal: Pt is eager to work with the HHPT that she worked with for L total knee PT Goal Formulation: With patient Time For Goal Achievement: 06/09/17 Potential to Achieve Goals: Good    Frequency Min 2X/week   Barriers to discharge        Co-evaluation               AM-PAC PT "6 Clicks" Daily Activity  Outcome Measure Difficulty turning over in bed (including adjusting bedclothes, sheets and blankets)?: None Difficulty moving from lying on back to sitting on the side of the bed? : None Difficulty sitting down on and standing up from a chair with arms (e.g., wheelchair, bedside commode, etc,.)?: None Help needed moving to and from a bed to chair (including a wheelchair)?: None Help needed walking in hospital room?: None Help needed climbing 3-5 steps with a railing? : None 6 Click Score: 24    End of Session Equipment Utilized During Treatment: Gait belt Activity Tolerance: Patient tolerated treatment well Patient left: with chair alarm set;with call bell/phone within reach Nurse Communication: Mobility status PT Visit Diagnosis: Muscle weakness (generalized) (M62.81);Difficulty in walking, not elsewhere classified (R26.2)    Time: [id number] PT Time Calculation (min) (ACUTE ONLY): 28 min   Charges:   PT Evaluation $PT Eval Low Complexity: 1 Low PT Treatments $Therapeutic Exercise: 8-22 mins   PT G Codes:        Malachi ProGalen R Zahriah Roes, DPT 06/09/2017, 11:09 AM

## 2017-06-09 NOTE — Progress Notes (Signed)
OT Screen Note  Patient Details Name: Tanya Vazquez MRN: [id number] DOB: 06/30/1945   Cancelled Treatment:    Reason Eval/Treat Not Completed: OT screened, no needs identified, will sign off. Order received, chart reviewed. Met with pt, spouse and daughter present. Pt reporting no pain before, during or after PT session. Pt attended joint class and able to recall polar care mgt, spouse independent with compression stocking mgt, pt has reacher at home and OT briefly reviewed how to use for LB dressing, and pt/spouse verbalized preparation for surgery and good supportive home set up in place. No skilled OT needs identified. Will sign off.   Jeni Salles, MPH, MS, OTR/L ascom [phone] 06/09/17, 10:03 AM

## 2017-06-09 NOTE — Progress Notes (Signed)
Pt alert and oriented. Minimal pain during the night. Surgical dressing remaining dry and intact. Tolerating Bone foam without difficulty. Iv infusing without difficulty. Pt up and in chair this morning and tolerating well.

## 2017-06-10 NOTE — Progress Notes (Signed)
Physical Therapy Treatment Patient Details Name: Tanya FountainJudy Vazquez MRN: [id number] DOB: 10-14-1945 Today's Date: 06/10/2017    History of Present Illness 72 y/o female s/p R total knee replacement, WBAT, 06/08/17.    PT Comments    Patient performing all mobility with RW, mod indep; good confidence and ease of movement.  Excellent WBing/control of R LE.  Patient comfortable with current functional abilities and voices no additional questions/concerns regarding upcoming discharge.  Did encourage transition to outpatient PT ASAP given functional performance this date.   Follow Up Recommendations  DC plan and follow up therapy as arranged by surgeon(encourage transition to outpatient PT ASAP)     Equipment Recommendations       Recommendations for Other Services       Precautions / Restrictions Precautions Precautions: Fall;Knee Restrictions Weight Bearing Restrictions: Yes RLE Weight Bearing: Weight bearing as tolerated    Mobility  Bed Mobility Overal bed mobility: Independent                Transfers Overall transfer level: Modified independent Equipment used: Rolling walker (2 wheeled)             General transfer comment: good LE strength/control, very minimal use of bilat UEs to assist with movement transition; LEs fairly symmetrical with good WBing and active use of R LE  Ambulation/Gait Ambulation/Gait assistance: Modified independent (Device/Increase time) Ambulation Distance (Feet): 400 Feet Assistive device: Rolling walker (2 wheeled)   Gait velocity: 10' walk time, 6 seconds   General Gait Details: reciprocal stepping pattern with good R LE stance time; good cadence with good mechanics, overall symmetry with gait efforts.   Stairs            Wheelchair Mobility    Modified Rankin (Stroke Patients Only)       Balance Overall balance assessment: Modified Independent                                          Cognition  Arousal/Alertness: Awake/alert Behavior During Therapy: WFL for tasks assessed/performed Overall Cognitive Status: Within Functional Limits for tasks assessed                                        Exercises Total Joint Exercises Goniometric ROM: 2-95 degrees, R knee Other Exercises Other Exercises: Standing LE therex, 1x15, AROM for muscular strength/endurance: heel raises, mini squats, marching and hip flex/ext/abduct/adduct.  Good R LE stance time and knee control, minimal use of UEs Other Exercises: Issued supine and standing LE therex packets for use as HEP; patient voiced understanding of all information.    General Comments        Pertinent Vitals/Pain Pain Assessment: 0-10 Pain Score: 2  Pain Location: R knee Pain Descriptors / Indicators: Aching;Grimacing;Guarding Pain Intervention(s): Limited activity within patient's tolerance;Monitored during session;Repositioned    Home Living                      Prior Function            PT Goals (current goals can now be found in the care plan section) Acute Rehab PT Goals Patient Stated Goal: Pt is eager to work with the HHPT that she worked with for L total knee PT Goal Formulation: With patient Time For Goal  Achievement: 06/09/17 Potential to Achieve Goals: Good Progress towards PT goals: Progressing toward goals    Frequency    BID      PT Plan Current plan remains appropriate    Co-evaluation              AM-PAC PT "6 Clicks" Daily Activity  Outcome Measure  Difficulty turning over in bed (including adjusting bedclothes, sheets and blankets)?: None Difficulty moving from lying on back to sitting on the side of the bed? : None Difficulty sitting down on and standing up from a chair with arms (e.g., wheelchair, bedside commode, etc,.)?: None Help needed moving to and from a bed to chair (including a wheelchair)?: None Help needed walking in hospital room?: None Help needed  climbing 3-5 steps with a railing? : None 6 Click Score: 24    End of Session Equipment Utilized During Treatment: Gait belt Activity Tolerance: Patient tolerated treatment well Patient left: with call bell/phone within reach;in chair;with chair alarm set Nurse Communication: Mobility status PT Visit Diagnosis: Muscle weakness (generalized) (M62.81);Difficulty in walking, not elsewhere classified (R26.2)     Time: 1610-9604 PT Time Calculation (min) (ACUTE ONLY): 18 min  Charges:  $Gait Training: 8-22 mins                    G Codes:       Gita Dilger H. Manson Passey, PT, DPT, NCS 06/10/17, 9:41 AM [phone]

## 2017-06-10 NOTE — Progress Notes (Signed)
Pt ready for d/c home today per MD. Pt met PT goals, has needed equipment per CM. Dressing was changed to right knee per order, and ted hose applied. Pt's BP has been elevated this morning to 170-180s, pt states she is a little anxious and ready to go home. Pt rates pain 2/10. Vance Peper notified of pt's BP, he stated it was ok for pt to go home. PIV removed. Prescriptions and discharge instructions reviewed with pt and family, all questions answered. Pt assisted to personal vehicle.   [place], Jerry Caras

## 2017-06-10 NOTE — Progress Notes (Signed)
   Subjective: 2 Days Post-Op Procedure(s) (LRB): COMPUTER ASSISTED TOTAL KNEE ARTHROPLASTY (Right) Patient reports pain as 0 on 0-10 scale.   Patient is well, and has had no acute complaints or problems Patient did extremely well yesterday. Made to left around the nurse's desk and did stairs. Has greater than 90 of range of motion of the knee already. Plan is to go Home after hospital stay. no nausea and no vomiting Patient denies any chest pains or shortness of breath. Objective: Vital signs in last 24 hours: Temp:  [97.6 F (36.4 C)-98.1 F (36.7 C)] 97.6 F (36.4 C) (01/08 2006) Pulse Rate:  [58-79] 79 (01/08 2006) Resp:  [15-20] 15 (01/08 2006) BP: (128-169)/(60-73) 169/72 (01/08 2006) SpO2:  [99 %-100 %] 100 % (01/08 2006) well approximated incision Heels are non tender and elevated off the bed using rolled towels Intake/Output from previous day: 01/08 0701 - 01/09 0700 In: 2595 [P.O.:960; I.V.:1525; IV Piggyback:100] Out: 830 [Urine:700; Drains:130] Intake/Output this shift: Total I/O In: 250 [P.O.:240; Other:10] Out: 830 [Urine:700; Drains:130]  No results for input(s): HGB in the last 72 hours. No results for input(s): WBC, RBC, HCT, PLT in the last 72 hours. No results for input(s): NA, K, CL, CO2, BUN, CREATININE, GLUCOSE, CALCIUM in the last 72 hours. No results for input(s): LABPT, INR in the last 72 hours.  EXAM General - Patient is Alert, Appropriate and Oriented Extremity - Neurologically intact Neurovascular intact Sensation intact distally Intact pulses distally Dorsiflexion/Plantar flexion intact No cellulitis present Compartment soft Dressing - dressing C/D/I Motor Function - intact, moving foot and toes well on exam.    Past Medical History:  Diagnosis Date  . Arthritis   . Complication of anesthesia    slow to wake up after anesthesia  . GERD (gastroesophageal reflux disease)     Assessment/Plan: 2 Days Post-Op Procedure(s)  (LRB): COMPUTER ASSISTED TOTAL KNEE ARTHROPLASTY (Right) Active Problems:   S/P total knee arthroplasty  Estimated body mass index is 25.06 kg/m as calculated from the following:   Height as of this encounter: 5\' 7"  (1.702 m).   Weight as of this encounter: 72.6 kg (160 lb). Up with therapy Discharge home with home health  Labs: None DVT Prophylaxis - Lovenox, Foot Pumps and TED hose Weight-Bearing as tolerated to right leg Hemovac was discontinued on today's visit. Into the Hemovac appeared to be intact. Please change dressing prior to patient being discharged and give the patient 2 extra dressings to take home. Please wash the operative leg and apply TED stockings to both legs   Mendi Constable R. [facility] Center[facility] PA [facility] 06/10/2017, 6:52 AM

## 2017-06-10 NOTE — Care Management Note (Signed)
Case Management Note  Patient Details  Name: Tanya Vazquez MRN: [id number] Date of Birth: 1945/09/15  Subjective/Objective:   Discharging today                 Action/Plan: [facility] notified of discharge. Cost of Lovenox is $ 64.00. Patient updated.   Expected Discharge Date:  06/10/17               Expected Discharge Plan:  Home w Home Health Services  In-House Referral:     Discharge planning Services  CM Consult  Post Acute Care Choice:  Home Health Choice offered to:  Patient  DME Arranged:    DME Agency:     HH Arranged:  PT HH Agency:  [facility] ([facility]  Status of Service:  Completed, signed off  If discussed at [employer] Meetings, dates discussed:    Additional Comments:  Marily MemosLisa M Laronica Bhagat, RN 06/10/2017, 8:51 AM

## 2019-08-23 IMAGING — DX DG KNEE 1-2V PORT*R*
2 series · 2 of 2 positions shown · non-contrast
Comparison: None.

CLINICAL DATA: Total knee arthroplasty

EXAM:
PORTABLE RIGHT KNEE - 1-2 VIEW

[knee ap]
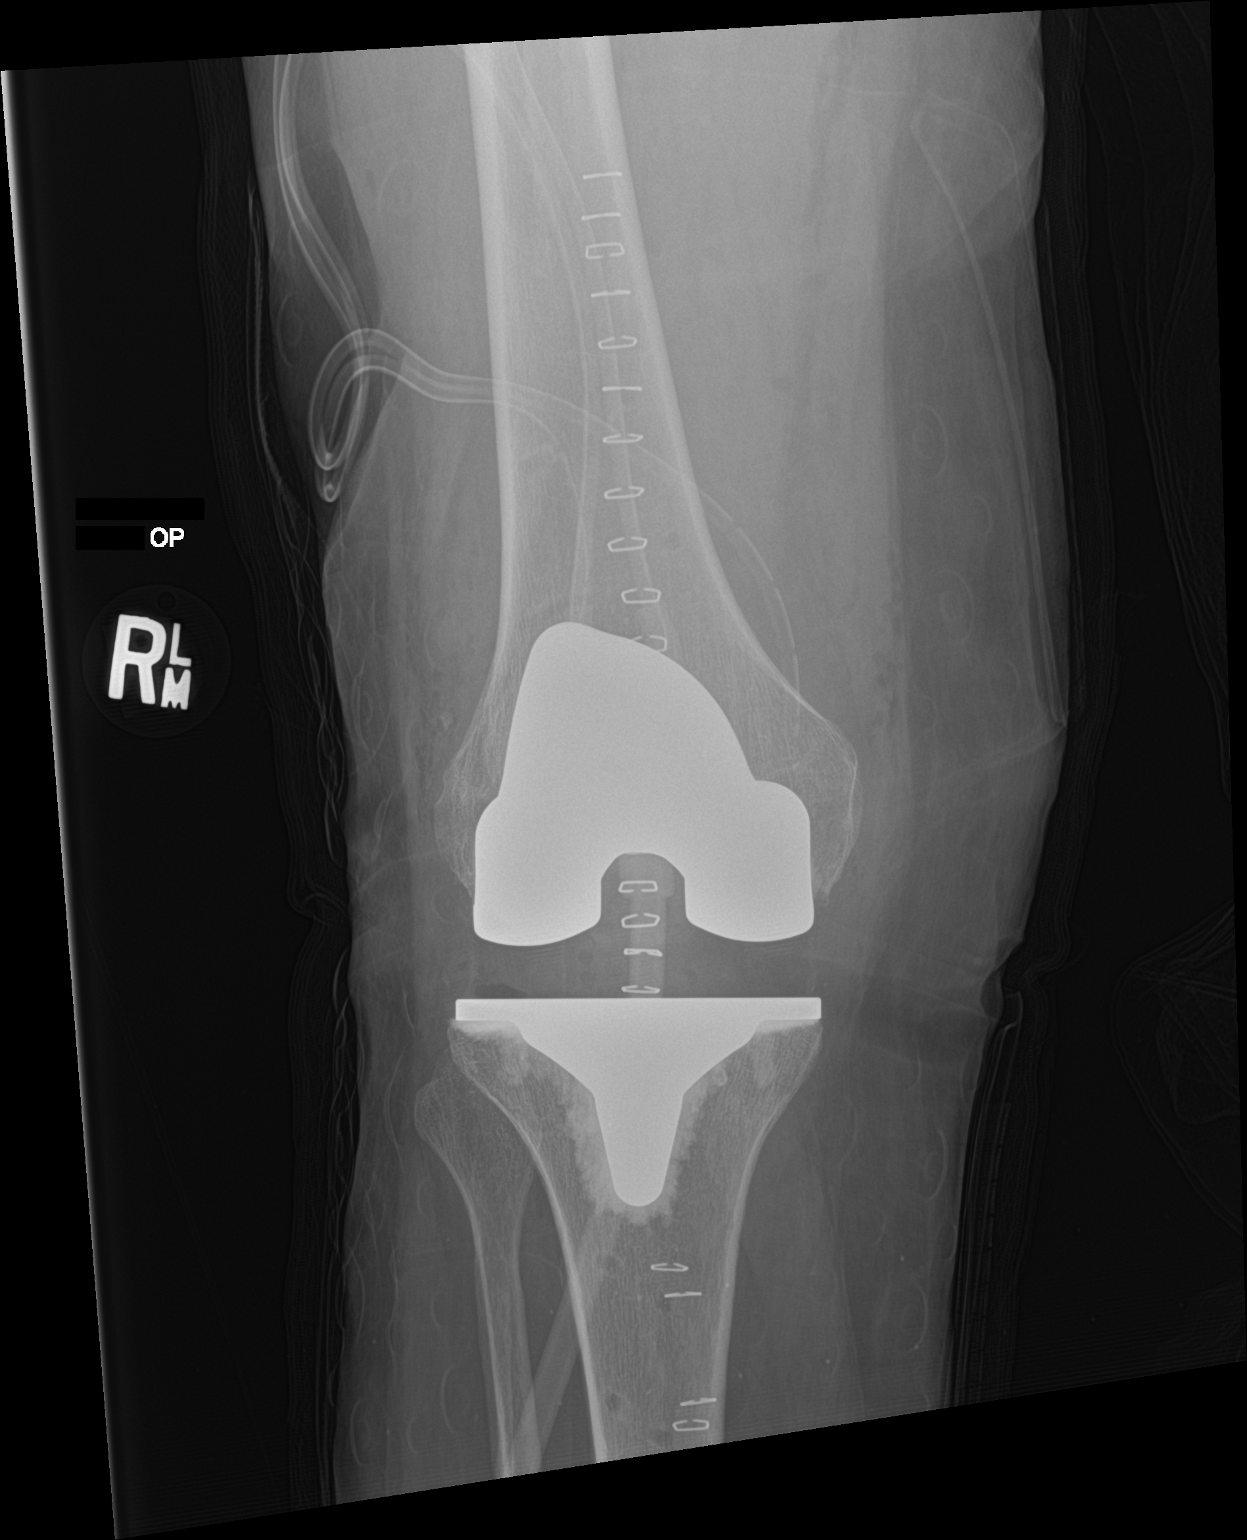

[knee lat]
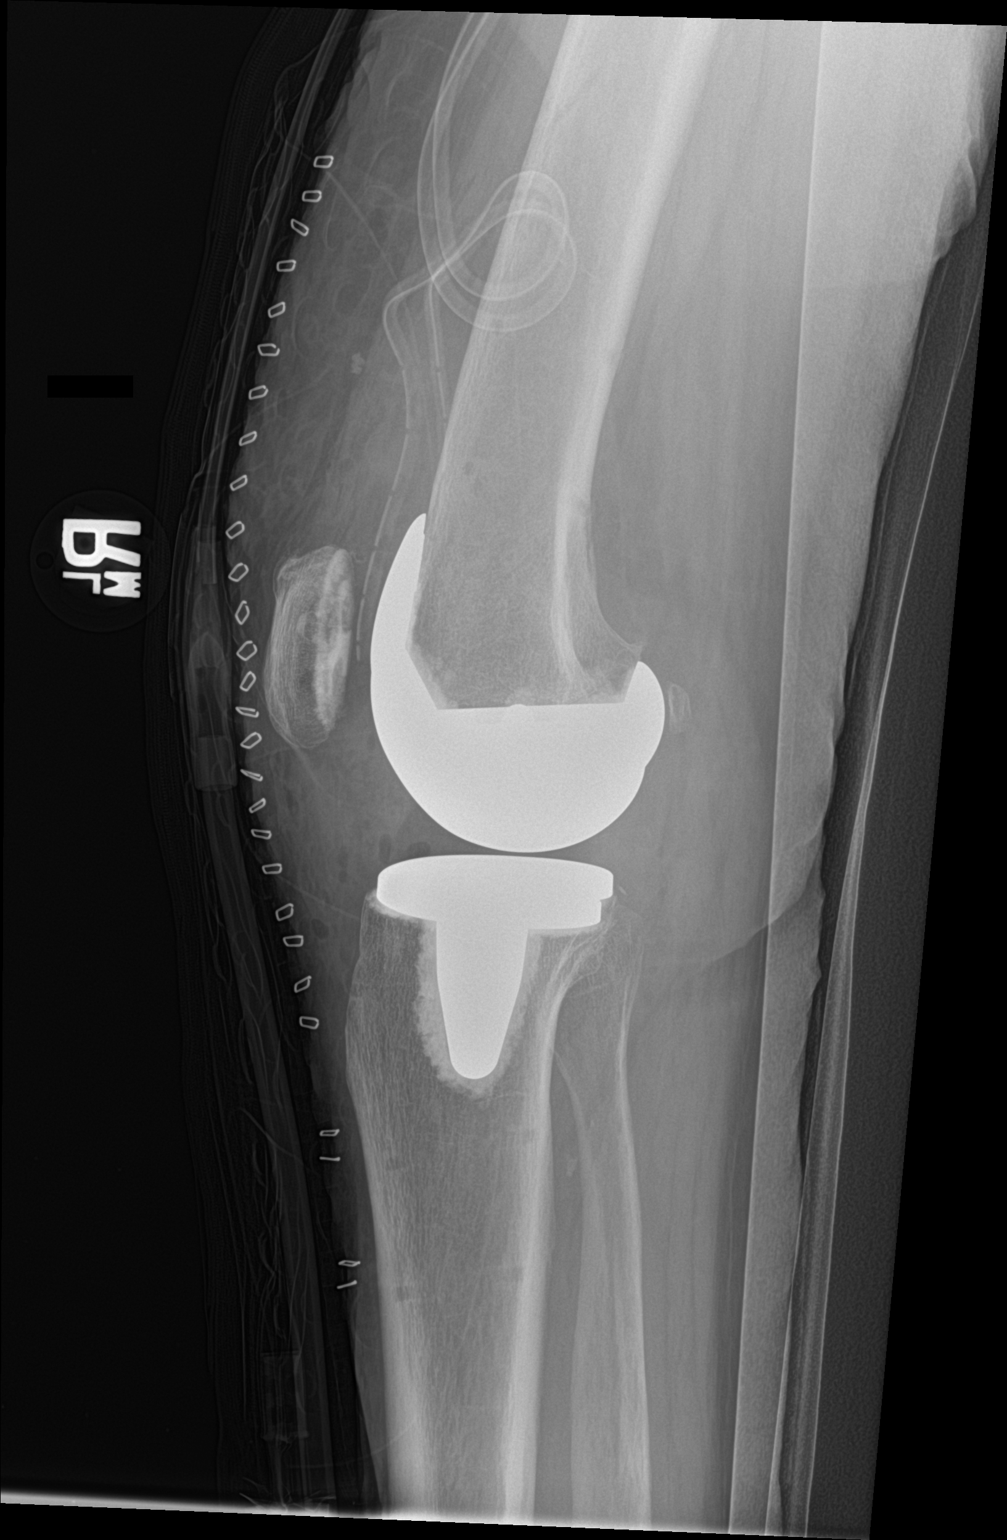

[2 of 2 positions shown; findings below may reference images not displayed]

FINDINGS: Total knee arthroplasty that is well seated. Negative for fracture
or malalignment. Expected soft tissue gas. Suprapatellar drain.
IMPRESSION: Total knee arthroplasty without complicating feature.
